# Patient Record
Sex: Male | Born: 1935 | Race: White | Hispanic: No | Smoking: Former smoker
Health system: Southern US, Community
[De-identification: ages and names within clinical notes are randomized; demographics above are authoritative.]

## PROBLEM LIST (undated history)

## (undated) DIAGNOSIS — I739 Peripheral vascular disease, unspecified: Secondary | ICD-10-CM

## (undated) DIAGNOSIS — IMO0002 Reserved for concepts with insufficient information to code with codable children: Secondary | ICD-10-CM

## (undated) DIAGNOSIS — E119 Type 2 diabetes mellitus without complications: Secondary | ICD-10-CM

## (undated) DIAGNOSIS — E785 Hyperlipidemia, unspecified: Secondary | ICD-10-CM

## (undated) DIAGNOSIS — I4891 Unspecified atrial fibrillation: Secondary | ICD-10-CM

## (undated) DIAGNOSIS — R972 Elevated prostate specific antigen [PSA]: Secondary | ICD-10-CM

## (undated) DIAGNOSIS — I2510 Atherosclerotic heart disease of native coronary artery without angina pectoris: Secondary | ICD-10-CM

## (undated) DIAGNOSIS — I482 Chronic atrial fibrillation, unspecified: Secondary | ICD-10-CM

## (undated) DIAGNOSIS — I6529 Occlusion and stenosis of unspecified carotid artery: Secondary | ICD-10-CM

## (undated) DIAGNOSIS — R413 Other amnesia: Secondary | ICD-10-CM

## (undated) DIAGNOSIS — K4090 Unilateral inguinal hernia, without obstruction or gangrene, not specified as recurrent: Secondary | ICD-10-CM

## (undated) DIAGNOSIS — I10 Essential (primary) hypertension: Secondary | ICD-10-CM

## (undated) HISTORY — DX: Unspecified atrial fibrillation: I48.91

## (undated) HISTORY — DX: Elevated prostate specific antigen (PSA): R97.20

## (undated) HISTORY — DX: Other amnesia: R41.3

## (undated) HISTORY — DX: Occlusion and stenosis of unspecified carotid artery: I65.29

## (undated) HISTORY — DX: Essential (primary) hypertension: I10

## (undated) HISTORY — DX: Hyperlipidemia, unspecified: E78.5

## (undated) HISTORY — DX: Unilateral inguinal hernia, without obstruction or gangrene, not specified as recurrent: K40.90

## (undated) HISTORY — DX: Peripheral vascular disease, unspecified: I73.9

## (undated) HISTORY — DX: Atherosclerotic heart disease of native coronary artery without angina pectoris: I25.10

## (undated) HISTORY — DX: Chronic atrial fibrillation, unspecified: I48.20

## (undated) HISTORY — DX: Reserved for concepts with insufficient information to code with codable children: IMO0002

## (undated) HISTORY — PX: INGUINAL HERNIA REPAIR: SHX194

## (undated) HISTORY — DX: Type 2 diabetes mellitus without complications: E11.9

## (undated) HISTORY — PX: OTHER SURGICAL HISTORY: SHX169

## (undated) HISTORY — PX: HEMORRHOID SURGERY: SHX153

---

## 1998-04-28 HISTORY — PX: FLEXIBLE SIGMOIDOSCOPY: SHX1649

## 1998-05-29 ENCOUNTER — Other Ambulatory Visit: Admission: RE | Admit: 1998-05-29 | Discharge: 1998-05-29 | Payer: Self-pay | Admitting: Urology

## 2000-01-23 ENCOUNTER — Emergency Department (HOSPITAL_COMMUNITY): Admission: EM | Admit: 2000-01-23 | Discharge: 2000-01-23 | Payer: Self-pay | Admitting: Emergency Medicine

## 2000-01-23 ENCOUNTER — Encounter: Payer: Self-pay | Admitting: Emergency Medicine

## 2002-10-30 LAB — HM COLONOSCOPY

## 2003-10-24 ENCOUNTER — Emergency Department (HOSPITAL_COMMUNITY): Admission: EM | Admit: 2003-10-24 | Discharge: 2003-10-24 | Payer: Self-pay | Admitting: Family Medicine

## 2003-10-28 ENCOUNTER — Emergency Department (HOSPITAL_COMMUNITY): Admission: EM | Admit: 2003-10-28 | Discharge: 2003-10-28 | Payer: Self-pay | Admitting: Family Medicine

## 2003-11-11 ENCOUNTER — Encounter: Admission: RE | Admit: 2003-11-11 | Discharge: 2003-11-11 | Payer: Self-pay | Admitting: Internal Medicine

## 2004-03-17 ENCOUNTER — Ambulatory Visit: Payer: Self-pay

## 2004-04-14 ENCOUNTER — Ambulatory Visit: Payer: Self-pay | Admitting: *Deleted

## 2004-05-12 ENCOUNTER — Ambulatory Visit: Payer: Self-pay | Admitting: Cardiology

## 2004-06-09 ENCOUNTER — Ambulatory Visit: Payer: Self-pay | Admitting: Cardiology

## 2004-07-07 ENCOUNTER — Ambulatory Visit: Payer: Self-pay | Admitting: *Deleted

## 2004-07-14 ENCOUNTER — Ambulatory Visit: Payer: Self-pay | Admitting: Cardiology

## 2004-08-04 ENCOUNTER — Ambulatory Visit: Payer: Self-pay | Admitting: Internal Medicine

## 2004-08-05 ENCOUNTER — Ambulatory Visit: Payer: Self-pay | Admitting: Internal Medicine

## 2004-08-07 ENCOUNTER — Ambulatory Visit: Payer: Self-pay | Admitting: Internal Medicine

## 2004-08-11 ENCOUNTER — Ambulatory Visit: Payer: Self-pay | Admitting: Cardiology

## 2004-08-25 ENCOUNTER — Ambulatory Visit: Payer: Self-pay | Admitting: Cardiology

## 2004-09-15 ENCOUNTER — Ambulatory Visit: Payer: Self-pay | Admitting: Internal Medicine

## 2004-09-18 ENCOUNTER — Ambulatory Visit: Payer: Self-pay | Admitting: Internal Medicine

## 2004-10-16 ENCOUNTER — Ambulatory Visit: Payer: Self-pay | Admitting: *Deleted

## 2004-11-06 ENCOUNTER — Ambulatory Visit: Payer: Self-pay | Admitting: Internal Medicine

## 2004-11-06 ENCOUNTER — Ambulatory Visit: Payer: Self-pay | Admitting: Cardiology

## 2004-12-04 ENCOUNTER — Ambulatory Visit: Payer: Self-pay | Admitting: Cardiovascular Disease

## 2004-12-07 ENCOUNTER — Ambulatory Visit: Payer: Self-pay | Admitting: Internal Medicine

## 2004-12-18 ENCOUNTER — Ambulatory Visit: Payer: Self-pay | Admitting: Cardiology

## 2005-01-14 ENCOUNTER — Ambulatory Visit: Payer: Self-pay | Admitting: Cardiology

## 2005-02-03 ENCOUNTER — Ambulatory Visit: Payer: Self-pay | Admitting: *Deleted

## 2005-02-12 ENCOUNTER — Ambulatory Visit: Payer: Self-pay | Admitting: Internal Medicine

## 2005-02-19 ENCOUNTER — Ambulatory Visit: Payer: Self-pay | Admitting: Internal Medicine

## 2005-02-22 ENCOUNTER — Ambulatory Visit: Payer: Self-pay | Admitting: Internal Medicine

## 2005-03-12 ENCOUNTER — Ambulatory Visit: Payer: Self-pay | Admitting: Internal Medicine

## 2005-04-07 ENCOUNTER — Ambulatory Visit: Payer: Self-pay | Admitting: Cardiology

## 2005-04-21 ENCOUNTER — Ambulatory Visit: Payer: Self-pay | Admitting: Cardiology

## 2005-05-14 ENCOUNTER — Ambulatory Visit: Payer: Self-pay | Admitting: Cardiology

## 2005-06-04 ENCOUNTER — Ambulatory Visit: Payer: Self-pay | Admitting: Cardiology

## 2005-06-11 ENCOUNTER — Ambulatory Visit: Payer: Self-pay | Admitting: Internal Medicine

## 2005-07-02 ENCOUNTER — Ambulatory Visit: Payer: Self-pay | Admitting: Internal Medicine

## 2005-07-30 ENCOUNTER — Ambulatory Visit: Payer: Self-pay | Admitting: Internal Medicine

## 2005-08-25 ENCOUNTER — Ambulatory Visit: Payer: Self-pay | Admitting: *Deleted

## 2005-09-22 ENCOUNTER — Ambulatory Visit: Payer: Self-pay | Admitting: Internal Medicine

## 2005-10-15 ENCOUNTER — Ambulatory Visit: Payer: Self-pay | Admitting: Cardiology

## 2005-10-18 ENCOUNTER — Ambulatory Visit: Payer: Self-pay | Admitting: Cardiology

## 2005-11-26 ENCOUNTER — Ambulatory Visit: Payer: Self-pay | Admitting: Internal Medicine

## 2005-11-26 ENCOUNTER — Ambulatory Visit: Payer: Self-pay | Admitting: Cardiology

## 2005-12-24 ENCOUNTER — Ambulatory Visit: Payer: Self-pay | Admitting: Internal Medicine

## 2006-02-18 ENCOUNTER — Ambulatory Visit: Payer: Self-pay | Admitting: Cardiology

## 2006-03-18 ENCOUNTER — Ambulatory Visit: Payer: Self-pay | Admitting: Cardiology

## 2006-04-08 ENCOUNTER — Ambulatory Visit: Payer: Self-pay | Admitting: Cardiology

## 2006-04-22 ENCOUNTER — Ambulatory Visit: Payer: Self-pay | Admitting: Cardiology

## 2006-04-29 ENCOUNTER — Ambulatory Visit: Payer: Self-pay | Admitting: Internal Medicine

## 2006-05-16 ENCOUNTER — Ambulatory Visit: Payer: Self-pay | Admitting: Cardiology

## 2006-06-22 ENCOUNTER — Ambulatory Visit: Payer: Self-pay | Admitting: Cardiology

## 2006-07-20 ENCOUNTER — Ambulatory Visit: Payer: Self-pay | Admitting: Cardiology

## 2006-08-02 ENCOUNTER — Ambulatory Visit: Payer: Self-pay | Admitting: Internal Medicine

## 2006-08-17 ENCOUNTER — Ambulatory Visit: Payer: Self-pay | Admitting: Cardiology

## 2006-09-14 ENCOUNTER — Ambulatory Visit: Payer: Self-pay | Admitting: Cardiology

## 2006-10-12 ENCOUNTER — Ambulatory Visit: Payer: Self-pay | Admitting: Cardiology

## 2006-11-01 ENCOUNTER — Ambulatory Visit: Payer: Self-pay | Admitting: Internal Medicine

## 2006-11-01 LAB — CONVERTED CEMR LAB: Hgb A1c MFr Bld: 7 % — ABNORMAL HIGH (ref 4.6–6.0)

## 2006-11-02 ENCOUNTER — Ambulatory Visit: Payer: Self-pay | Admitting: Cardiology

## 2006-11-02 HISTORY — PX: ELECTROCARDIOGRAM: SHX264

## 2006-11-02 LAB — CONVERTED CEMR LAB
ALT: 27 units/L (ref 0–40)
AST: 30 units/L (ref 0–37)
Albumin: 3.8 g/dL (ref 3.5–5.2)
Alkaline Phosphatase: 62 units/L (ref 39–117)
BUN: 14 mg/dL (ref 6–23)
Basophils Absolute: 0 10*3/uL (ref 0.0–0.1)
Basophils Relative: 0.1 % (ref 0.0–1.0)
Bilirubin, Direct: 0.2 mg/dL (ref 0.0–0.3)
CO2: 28 meq/L (ref 19–32)
Calcium: 8.9 mg/dL (ref 8.4–10.5)
Chloride: 108 meq/L (ref 96–112)
Cholesterol: 175 mg/dL (ref 0–200)
Creatinine, Ser: 1.1 mg/dL (ref 0.4–1.5)
Eosinophils Absolute: 0.1 10*3/uL (ref 0.0–0.6)
Eosinophils Relative: 0.8 % (ref 0.0–5.0)
GFR calc Af Amer: 84 mL/min
GFR calc non Af Amer: 69 mL/min
Glucose, Bld: 116 mg/dL — ABNORMAL HIGH (ref 70–99)
HCT: 43.3 % (ref 39.0–52.0)
HDL: 41.3 mg/dL (ref >39.0)
Hemoglobin: 15.1 g/dL (ref 13.0–17.0)
LDL Cholesterol: 99 mg/dL (ref 0–99)
Lymphocytes Relative: 22 % (ref 12.0–46.0)
MCHC: 34.8 g/dL (ref 30.0–36.0)
MCV: 93 fL (ref 78.0–100.0)
Monocytes Absolute: 0.8 10*3/uL — ABNORMAL HIGH (ref 0.2–0.7)
Monocytes Relative: 10.1 % (ref 3.0–11.0)
Neutro Abs: 5.4 10*3/uL (ref 1.4–7.7)
Neutrophils Relative %: 67 % (ref 43.0–77.0)
Platelets: 221 10*3/uL (ref 150–400)
Potassium: 4 meq/L (ref 3.5–5.1)
RBC: 4.66 M/uL (ref 4.22–5.81)
RDW: 12.4 % (ref 11.5–14.6)
Sodium: 140 meq/L (ref 135–145)
Total Bilirubin: 0.8 mg/dL (ref 0.3–1.2)
Total CHOL/HDL Ratio: 4.2
Total Protein: 6.3 g/dL (ref 6.0–8.3)
Triglycerides: 175 mg/dL — ABNORMAL HIGH (ref 0–149)
VLDL: 35 mg/dL (ref 0–40)
WBC: 8.1 10*3/uL (ref 4.5–10.5)

## 2006-11-15 ENCOUNTER — Ambulatory Visit: Payer: Self-pay

## 2006-11-15 HISTORY — PX: OTHER SURGICAL HISTORY: SHX169

## 2006-11-30 ENCOUNTER — Ambulatory Visit: Payer: Self-pay | Admitting: Cardiovascular Disease

## 2006-12-08 ENCOUNTER — Ambulatory Visit: Payer: Self-pay | Admitting: *Deleted

## 2006-12-09 ENCOUNTER — Encounter: Payer: Self-pay | Admitting: Internal Medicine

## 2006-12-09 DIAGNOSIS — E785 Hyperlipidemia, unspecified: Secondary | ICD-10-CM

## 2006-12-09 DIAGNOSIS — R972 Elevated prostate specific antigen [PSA]: Secondary | ICD-10-CM

## 2006-12-09 DIAGNOSIS — I2510 Atherosclerotic heart disease of native coronary artery without angina pectoris: Secondary | ICD-10-CM

## 2006-12-09 DIAGNOSIS — I4891 Unspecified atrial fibrillation: Secondary | ICD-10-CM | POA: Insufficient documentation

## 2006-12-09 DIAGNOSIS — E1121 Type 2 diabetes mellitus with diabetic nephropathy: Secondary | ICD-10-CM | POA: Insufficient documentation

## 2006-12-21 ENCOUNTER — Ambulatory Visit: Payer: Self-pay | Admitting: Cardiology

## 2007-01-02 ENCOUNTER — Ambulatory Visit: Payer: Self-pay | Admitting: Cardiology

## 2007-01-12 ENCOUNTER — Ambulatory Visit: Payer: Self-pay | Admitting: *Deleted

## 2007-01-12 ENCOUNTER — Encounter: Admission: RE | Admit: 2007-01-12 | Discharge: 2007-01-12 | Payer: Self-pay | Admitting: *Deleted

## 2007-03-07 ENCOUNTER — Encounter (INDEPENDENT_AMBULATORY_CARE_PROVIDER_SITE_OTHER): Payer: Self-pay | Admitting: *Deleted

## 2007-03-07 ENCOUNTER — Inpatient Hospital Stay (HOSPITAL_COMMUNITY): Admission: RE | Admit: 2007-03-07 | Discharge: 2007-03-08 | Payer: Self-pay | Admitting: *Deleted

## 2007-03-07 ENCOUNTER — Ambulatory Visit: Payer: Self-pay | Admitting: *Deleted

## 2007-03-07 DIAGNOSIS — Z9889 Other specified postprocedural states: Secondary | ICD-10-CM | POA: Insufficient documentation

## 2007-03-10 ENCOUNTER — Ambulatory Visit: Payer: Self-pay | Admitting: Cardiology

## 2007-03-17 ENCOUNTER — Ambulatory Visit: Payer: Self-pay | Admitting: Cardiology

## 2007-03-23 ENCOUNTER — Encounter: Payer: Self-pay | Admitting: Internal Medicine

## 2007-03-23 ENCOUNTER — Ambulatory Visit: Payer: Self-pay | Admitting: *Deleted

## 2007-04-17 ENCOUNTER — Ambulatory Visit: Payer: Self-pay | Admitting: Cardiology

## 2007-04-26 ENCOUNTER — Ambulatory Visit: Payer: Self-pay | Admitting: Internal Medicine

## 2007-04-26 DIAGNOSIS — F0280 Dementia in other diseases classified elsewhere without behavioral disturbance: Secondary | ICD-10-CM

## 2007-04-26 DIAGNOSIS — I739 Peripheral vascular disease, unspecified: Secondary | ICD-10-CM

## 2007-04-26 DIAGNOSIS — G309 Alzheimer's disease, unspecified: Secondary | ICD-10-CM

## 2007-04-28 ENCOUNTER — Encounter: Payer: Self-pay | Admitting: Internal Medicine

## 2007-05-23 ENCOUNTER — Ambulatory Visit: Payer: Self-pay | Admitting: Cardiology

## 2007-06-20 ENCOUNTER — Ambulatory Visit: Payer: Self-pay | Admitting: Cardiology

## 2007-07-19 ENCOUNTER — Ambulatory Visit: Payer: Self-pay | Admitting: Internal Medicine

## 2007-08-02 ENCOUNTER — Ambulatory Visit: Payer: Self-pay | Admitting: Cardiology

## 2007-08-23 ENCOUNTER — Ambulatory Visit: Payer: Self-pay | Admitting: Internal Medicine

## 2007-09-15 ENCOUNTER — Ambulatory Visit: Payer: Self-pay | Admitting: Cardiology

## 2007-10-13 ENCOUNTER — Ambulatory Visit: Payer: Self-pay | Admitting: Cardiology

## 2007-11-10 ENCOUNTER — Ambulatory Visit: Payer: Self-pay | Admitting: Cardiology

## 2007-11-14 ENCOUNTER — Ambulatory Visit: Payer: Self-pay | Admitting: Cardiology

## 2007-11-14 LAB — CONVERTED CEMR LAB
Basophils Absolute: 0 10*3/uL (ref 0.0–0.1)
Basophils Relative: 0 % (ref 0.0–1.0)
Chloride: 104 meq/L (ref 96–112)
Creatinine, Ser: 1.2 mg/dL (ref 0.4–1.5)
Eosinophils Absolute: 0.1 10*3/uL (ref 0.0–0.7)
GFR calc non Af Amer: 62 mL/min
MCHC: 35.4 g/dL (ref 30.0–36.0)
MCV: 93.4 fL (ref 78.0–100.0)
Neutrophils Relative %: 66.3 % (ref 43.0–77.0)
Platelets: 227 10*3/uL (ref 150–400)
RBC: 4.81 M/uL (ref 4.22–5.81)
RDW: 13.1 % (ref 11.5–14.6)
Sodium: 138 meq/L (ref 135–145)

## 2007-12-18 ENCOUNTER — Ambulatory Visit: Payer: Self-pay | Admitting: Cardiology

## 2007-12-27 ENCOUNTER — Ambulatory Visit: Payer: Self-pay | Admitting: Internal Medicine

## 2007-12-27 DIAGNOSIS — K4090 Unilateral inguinal hernia, without obstruction or gangrene, not specified as recurrent: Secondary | ICD-10-CM | POA: Insufficient documentation

## 2007-12-28 ENCOUNTER — Ambulatory Visit: Payer: Self-pay | Admitting: Cardiology

## 2008-01-18 ENCOUNTER — Ambulatory Visit: Payer: Self-pay | Admitting: Cardiology

## 2008-03-11 ENCOUNTER — Telehealth: Payer: Self-pay | Admitting: Internal Medicine

## 2008-03-14 ENCOUNTER — Ambulatory Visit: Payer: Self-pay | Admitting: Cardiovascular Disease

## 2008-03-21 ENCOUNTER — Ambulatory Visit: Payer: Self-pay | Admitting: Cardiology

## 2008-04-02 ENCOUNTER — Ambulatory Visit: Payer: Self-pay | Admitting: Internal Medicine

## 2008-04-04 ENCOUNTER — Ambulatory Visit: Payer: Self-pay | Admitting: Internal Medicine

## 2008-05-02 ENCOUNTER — Ambulatory Visit: Payer: Self-pay | Admitting: Cardiology

## 2008-06-05 ENCOUNTER — Ambulatory Visit: Payer: Self-pay | Admitting: Cardiovascular Disease

## 2008-06-26 ENCOUNTER — Ambulatory Visit: Payer: Self-pay | Admitting: Cardiology

## 2008-07-09 ENCOUNTER — Ambulatory Visit: Payer: Self-pay | Admitting: Cardiovascular Disease

## 2008-07-30 ENCOUNTER — Ambulatory Visit: Payer: Self-pay | Admitting: Cardiology

## 2008-08-13 ENCOUNTER — Ambulatory Visit: Payer: Self-pay | Admitting: Cardiology

## 2008-09-03 ENCOUNTER — Ambulatory Visit: Payer: Self-pay | Admitting: Cardiology

## 2008-10-04 ENCOUNTER — Telehealth: Payer: Self-pay | Admitting: Internal Medicine

## 2008-10-04 ENCOUNTER — Ambulatory Visit: Payer: Self-pay | Admitting: Internal Medicine

## 2008-10-16 ENCOUNTER — Encounter: Payer: Self-pay | Admitting: *Deleted

## 2008-10-17 ENCOUNTER — Telehealth: Payer: Self-pay | Admitting: Internal Medicine

## 2008-11-01 ENCOUNTER — Ambulatory Visit: Payer: Self-pay | Admitting: Internal Medicine

## 2008-11-01 LAB — CONVERTED CEMR LAB: Protime: 21.2

## 2008-11-07 ENCOUNTER — Telehealth: Payer: Self-pay | Admitting: Internal Medicine

## 2008-11-20 ENCOUNTER — Encounter: Payer: Self-pay | Admitting: *Deleted

## 2008-11-23 ENCOUNTER — Emergency Department (HOSPITAL_COMMUNITY): Admission: EM | Admit: 2008-11-23 | Discharge: 2008-11-23 | Payer: Self-pay | Admitting: Emergency Medicine

## 2008-11-29 ENCOUNTER — Ambulatory Visit: Payer: Self-pay | Admitting: Cardiology

## 2008-11-29 LAB — CONVERTED CEMR LAB: POC INR: 4

## 2008-12-13 ENCOUNTER — Ambulatory Visit: Payer: Self-pay | Admitting: Cardiology

## 2008-12-16 ENCOUNTER — Telehealth: Payer: Self-pay | Admitting: Cardiology

## 2008-12-16 ENCOUNTER — Encounter: Payer: Self-pay | Admitting: Internal Medicine

## 2008-12-19 ENCOUNTER — Telehealth: Payer: Self-pay | Admitting: Internal Medicine

## 2008-12-19 ENCOUNTER — Telehealth: Payer: Self-pay | Admitting: Cardiology

## 2008-12-19 ENCOUNTER — Ambulatory Visit: Payer: Self-pay | Admitting: Internal Medicine

## 2008-12-25 ENCOUNTER — Encounter: Admission: RE | Admit: 2008-12-25 | Discharge: 2008-12-25 | Payer: Self-pay | Admitting: Surgery

## 2008-12-26 ENCOUNTER — Ambulatory Visit (HOSPITAL_BASED_OUTPATIENT_CLINIC_OR_DEPARTMENT_OTHER): Admission: RE | Admit: 2008-12-26 | Discharge: 2008-12-26 | Payer: Self-pay | Admitting: Surgery

## 2008-12-26 ENCOUNTER — Encounter (INDEPENDENT_AMBULATORY_CARE_PROVIDER_SITE_OTHER): Payer: Self-pay | Admitting: Surgery

## 2009-01-03 ENCOUNTER — Ambulatory Visit: Payer: Self-pay | Admitting: Cardiovascular Disease

## 2009-01-03 LAB — CONVERTED CEMR LAB: POC INR: 2.1

## 2009-01-09 DIAGNOSIS — I10 Essential (primary) hypertension: Secondary | ICD-10-CM

## 2009-01-09 DIAGNOSIS — IMO0002 Reserved for concepts with insufficient information to code with codable children: Secondary | ICD-10-CM | POA: Insufficient documentation

## 2009-01-09 DIAGNOSIS — I6529 Occlusion and stenosis of unspecified carotid artery: Secondary | ICD-10-CM

## 2009-01-09 DIAGNOSIS — Z9889 Other specified postprocedural states: Secondary | ICD-10-CM | POA: Insufficient documentation

## 2009-01-10 ENCOUNTER — Ambulatory Visit: Payer: Self-pay | Admitting: Cardiology

## 2009-01-22 ENCOUNTER — Encounter: Payer: Self-pay | Admitting: Internal Medicine

## 2009-01-24 ENCOUNTER — Ambulatory Visit: Payer: Self-pay

## 2009-01-24 ENCOUNTER — Encounter: Payer: Self-pay | Admitting: Cardiology

## 2009-01-24 ENCOUNTER — Ambulatory Visit: Payer: Self-pay | Admitting: Internal Medicine

## 2009-01-24 LAB — CONVERTED CEMR LAB: POC INR: 2.2

## 2009-02-21 ENCOUNTER — Ambulatory Visit: Payer: Self-pay | Admitting: Internal Medicine

## 2009-03-07 ENCOUNTER — Ambulatory Visit: Payer: Self-pay | Admitting: Internal Medicine

## 2009-03-07 LAB — CONVERTED CEMR LAB: POC INR: 1.8

## 2009-03-21 ENCOUNTER — Ambulatory Visit: Payer: Self-pay | Admitting: Cardiology

## 2009-03-21 LAB — CONVERTED CEMR LAB: POC INR: 2.1

## 2009-04-08 ENCOUNTER — Telehealth: Payer: Self-pay | Admitting: Internal Medicine

## 2009-04-18 ENCOUNTER — Ambulatory Visit: Payer: Self-pay | Admitting: Cardiovascular Disease

## 2009-05-06 ENCOUNTER — Ambulatory Visit: Payer: Self-pay | Admitting: Cardiology

## 2009-05-06 LAB — CONVERTED CEMR LAB: INR: 1.9

## 2009-05-19 ENCOUNTER — Telehealth: Payer: Self-pay | Admitting: Internal Medicine

## 2009-06-03 ENCOUNTER — Ambulatory Visit: Payer: Self-pay | Admitting: Cardiology

## 2009-07-01 ENCOUNTER — Ambulatory Visit: Payer: Self-pay | Admitting: Cardiology

## 2009-07-29 ENCOUNTER — Ambulatory Visit: Payer: Self-pay | Admitting: Cardiology

## 2009-08-26 ENCOUNTER — Ambulatory Visit: Payer: Self-pay | Admitting: Cardiology

## 2009-08-26 LAB — CONVERTED CEMR LAB: POC INR: 2.5

## 2009-09-23 ENCOUNTER — Ambulatory Visit: Payer: Self-pay | Admitting: Cardiology

## 2009-09-23 LAB — CONVERTED CEMR LAB: POC INR: 2.5

## 2009-10-16 ENCOUNTER — Ambulatory Visit: Payer: Self-pay | Admitting: Internal Medicine

## 2009-10-16 DIAGNOSIS — L578 Other skin changes due to chronic exposure to nonionizing radiation: Secondary | ICD-10-CM | POA: Insufficient documentation

## 2009-10-21 ENCOUNTER — Ambulatory Visit: Payer: Self-pay | Admitting: Cardiology

## 2009-11-18 ENCOUNTER — Ambulatory Visit: Payer: Self-pay | Admitting: Internal Medicine

## 2009-11-18 LAB — CONVERTED CEMR LAB: POC INR: 2.7

## 2009-12-16 ENCOUNTER — Ambulatory Visit: Payer: Self-pay | Admitting: Cardiology

## 2009-12-16 LAB — CONVERTED CEMR LAB: POC INR: 3.5

## 2010-01-06 ENCOUNTER — Ambulatory Visit: Payer: Self-pay | Admitting: Cardiovascular Disease

## 2010-02-16 ENCOUNTER — Encounter: Payer: Self-pay | Admitting: Internal Medicine

## 2010-02-16 ENCOUNTER — Telehealth: Payer: Self-pay | Admitting: Internal Medicine

## 2010-03-16 ENCOUNTER — Ambulatory Visit: Payer: Self-pay | Admitting: Internal Medicine

## 2010-03-16 LAB — CONVERTED CEMR LAB: POC INR: 2.9

## 2010-05-22 ENCOUNTER — Ambulatory Visit: Admission: RE | Admit: 2010-05-22 | Discharge: 2010-05-22 | Payer: Self-pay | Source: Home / Self Care

## 2010-06-12 ENCOUNTER — Ambulatory Visit: Admission: RE | Admit: 2010-06-12 | Discharge: 2010-06-12 | Payer: Self-pay | Source: Home / Self Care

## 2010-06-14 LAB — CONVERTED CEMR LAB
ALT: 25 units/L (ref 0–53)
Basophils Relative: 0.3 % (ref 0.0–3.0)
Chloride: 104 meq/L (ref 96–112)
Cholesterol: 171 mg/dL (ref 0–200)
Eosinophils Absolute: 0.1 10*3/uL (ref 0.0–0.7)
Eosinophils Relative: 1.3 % (ref 0.0–5.0)
HDL: 60.5 mg/dL (ref >39.00)
Hgb A1c MFr Bld: 6.4 % (ref 4.6–6.5)
Lymphocytes Relative: 24.7 % (ref 12.0–46.0)
MCHC: 34.9 g/dL (ref 30.0–36.0)
Neutrophils Relative %: 63.1 % (ref 43.0–77.0)
Potassium: 4.5 meq/L (ref 3.5–5.1)
RBC: 4.44 M/uL (ref 4.22–5.81)
Total Protein: 6.5 g/dL (ref 6.0–8.3)
Triglycerides: 99 mg/dL (ref 0.0–149.0)
VLDL: 19.8 mg/dL (ref 0.0–40.0)
WBC: 7.3 10*3/uL (ref 4.5–10.5)

## 2010-06-16 NOTE — Medication Information (Signed)
Summary: rov/ewj  Anticoagulant Therapy  Managed by: Weston Brass, PharmD Referring MD: Charlies Constable MD PCP: Link Snuffer MD: Shirlee Latch MD, Dalton Indication 1: Atrial Fibrillation (ICD-427.31) Lab Used: [facility]  Site: [facility] INR POC 2.5 INR RANGE 2-3  Dietary changes: no    Health status changes: no    Bleeding/hemorrhagic complications: no    Recent/future hospitalizations: no    Any changes in medication regimen? no    Recent/future dental: no  Any missed doses?: no       Is patient compliant with meds? yes       Allergies: No Known Drug Allergies  Anticoagulation Management History:      The patient is taking warfarin and comes in today for a routine follow up visit.  Positive risk factors for bleeding include an age of 75 years or older and presence of serious comorbidities.  The bleeding index is 'intermediate risk'.  Positive CHADS2 values include History of HTN, Age > 36 years old, and History of Diabetes.  The start date was 01/28/2003.  His last INR was 1.9.  Anticoagulation responsible provider: Shirlee Latch MD, Dalton.  INR POC: 2.5.  Cuvette Lot#: [id number].  Exp: 09/2010.    Anticoagulation Management Assessment/Plan:      The patient's current anticoagulation dose is Coumadin 5 mg  tabs: take as directed.  The target INR is 2 - 3.  The next INR is due 09/23/2009.  Anticoagulation instructions were given to patient.  Results were reviewed/authorized by Weston Brass, PharmD.  He was notified by Weston Brass PharmD.         Prior Anticoagulation Instructions: INR 2.7  Continue on same dosage 1 tablet daily.  Recheck in 4 weeks.    Current Anticoagulation Instructions: INR 2.5  Continue same dose of 1 tablet every day

## 2010-06-16 NOTE — Medication Information (Signed)
Summary: ROV.MP  Anticoagulant Therapy  Managed by: Cloyde Reams, RN, BSN Referring MD: Charlies Constable MD PCP: Link Snuffer MD: Myrtis Ser MD, Tinnie Gens Indication 1: Atrial Fibrillation (ICD-427.31) Lab Used: [facility] [place] Site: [facility] INR POC 2.4 INR RANGE 2-3  Dietary changes: no    Health status changes: no    Bleeding/hemorrhagic complications: no    Recent/future hospitalizations: no    Any changes in medication regimen? no    Recent/future dental: no  Any missed doses?: no       Is patient compliant with meds? yes       Allergies (verified): No Known Drug Allergies  Anticoagulation Management History:      The patient is taking warfarin and comes in today for a routine follow up visit.  Positive risk factors for bleeding include an age of 75 years or older and presence of serious comorbidities.  The bleeding index is 'intermediate risk'.  Positive CHADS2 values include History of HTN, Age > 86 years old, and History of Diabetes.  The start date was 01/28/2003.  His last INR was 1.9.  Anticoagulation responsible provider: Myrtis Ser MD, Tinnie Gens.  INR POC: 2.4.  Cuvette Lot#: [id number].  Exp: 08/2010.    Anticoagulation Management Assessment/Plan:      The patient's current anticoagulation dose is Coumadin 5 mg  tabs: take as directed.  The target INR is 2 - 3.  The next INR is due 07/29/2009.  Anticoagulation instructions were given to patient.  Results were reviewed/authorized by Cloyde Reams, RN, BSN.  He was notified by Cloyde Reams RN.         Prior Anticoagulation Instructions: INR 2.4  Continue same dose of 1 tablet daily. Recheck in 4 weeks.  Current Anticoagulation Instructions: INR 2.4  Continue on same dosage 1 tablet daily.  Recheck in 4 weeks.

## 2010-06-16 NOTE — Assessment & Plan Note (Signed)
Summary: YEARLY FU/ LABS SAME DAY/NWS   Vital Signs:  Patient profile:   77 year old male Height:      74 inches (187.96 cm) Weight:      182.4 pounds (82.91 kg) BMI:     23.50 O2 Sat:      95 % on Room air Temp:     97.1 degrees F (36.17 degrees C) oral Pulse rate:   63 / minute BP sitting:   120 / 68  (left arm) Cuff size:   large  Vitals Entered By: Orlan Leavens (October 16, 2009 11:32 AM)  O2 Flow:  Room air CC: Yearly follow-up/ Req witten rx on all meds/ Clarification on amayl pt states only taking 1/2 qd  Is Patient Diabetic? Yes Did you bring your meter with you today? No Pain Assessment Patient in pain? no        Primary Care Provider:  Rosela Supak  CC:  Yearly follow-up/ Req witten rx on all meds/ Clarification on amayl pt states only taking 1/2 qd .  History of Present Illness: Patient presents for general medical follow-up. He has been feeling well. His daughter is concerned for hearing loss. She also wants to have him referred to Dr. Margo Aye for derm follow-up. He is due for routine lab. He is due to MMSE.   Current Medications (verified): 1)  Lanoxin 0.25 Mg  Tabs (Digoxin) .... Take 1 By Mouth Qd 2)  Altace 10 Mg  Caps (Ramipril) .... Take 1 By Mouth Qd 3)  Aricept 10 Mg  Tabs (Donepezil Hcl) .... Take 1 By Mouth Qd 4)  Coumadin 5 Mg  Tabs (Warfarin Sodium) .... Take As Directed 5)  Simvastatin 20 Mg  Tabs (Simvastatin) .... Take 1 By Mouth Qd 6)  Amaryl 1 Mg  Tabs (Glimepiride) .... Take 1 By Mouth Qd 7)  Aspirin 81 Mg  Tabs (Aspirin) .... Take 1 Tablet By Mouth Once A Day  Allergies (verified): No Known Drug Allergies  Past History:  Past Medical History: Last updated: 01/09/2009 DEGENERATIVE DISC DISEASE (ICD-722.6) CAROTID ARTERY STENOSIS, RIGHT (ICD-433.10) HYPERTENSION (ICD-401.9) INGUINAL HERNIA, RIGHT (ICD-550.90) MEMORY LOSS (ICD-780.93) PERIPHERAL VASCULAR DISEASE (ICD-443.9) DIABETES MELLITUS, TYPE II (ICD-250.00) CORONARY ARTERY DISEASE  (ICD-414.00) PSA, INCREASED (ICD-790.93) ATRIAL FIBRILLATION (ICD-427.31) HYPERLIPIDEMIA (ICD-272.4) 1. Chronic atrial fibrillation, on rate control, medicines and     Coumadin. 2. Hypertension. 3. Diabetes. 4. Hyperlipidemia. 5. Carotid stenosis status post left carotid endarterectomy in 2008.    Past Surgical History: Last updated: 01/09/2009 Flexible Sigmoidoscopy (04/28/1998) Carotid Duplex (11/15/2006) EKG (11/02/2006) CAROTID ENDARTERECTOMY, LEFT, HX OF (ICD-V15.1) HEMORRHOIDECTOMY, HX OF (ICD-V45.89) INGUINAL HERNIORRHAPHY, RIGHT, HX OF (ICD-V45.89)  Family History: Last updated: 01-09-2008 Mother: died of stroke at 38 Father: died at age 61 Brother: died in his 58s from Parkinson's disease Sister: died in her 41s  Social History: Last updated: 12/19/2008 Widowed; married in 1960 He has grown [phone]) and son ( ~1966), several grandchildren. 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   He enjoys playing lots of golf, and traveling with his girlfriend.    Risk Factors: Exercise: yes (09-Jan-2008)  Risk Factors: Smoking Status: quit (12/09/2006)  Review of Systems  The patient denies anorexia, fever, weight loss, weight gain, hoarseness, chest pain, syncope, dyspnea on exertion, prolonged cough, hemoptysis, abdominal pain, severe indigestion/heartburn, incontinence, muscle weakness, difficulty walking, depression, abnormal bleeding, and enlarged lymph nodes.    Physical Exam  General:  Tall, thin but well nourished white male in  no distress Head:  Normocephalic and atraumatic without obvious abnormalities. No apparent alopecia or balding. Eyes:  vision grossly intact, pupils equal, pupils round, corneas and lenses clear, and no injection.   Ears:  cerumen impaction right EAC - after irrigation small abrasion to the canal, TM normal. Left EAC/TM normal Nose:  no external deformity and no external erythema.   Mouth:   Oral mucosa and oropharynx without lesions or exudates.  Teeth in good repair. Neck:  supple, full ROM, no thyromegaly, and no carotid bruits.   Chest Wall:  No deformities, masses, tenderness or gynecomastia noted. Lungs:  Normal respiratory effort, chest expands symmetrically. Lungs are clear to auscultation, no crackles or wheezes. Heart:  IRIR rate controlled. No murmur Abdomen:  soft, non-tender, normal bowel sounds, and no hepatomegaly.   Prostate:  deferred to age Msk:  normal ROM, no joint tenderness, no joint swelling, no joint warmth, no redness over joints, and no joint deformities.   Pulses:  2+ radial  Extremities:  No clubbing, cyanosis, edema, or deformity noted with normal full range of motion of all joints.   Neurologic:  MMSE 1. day - no, month -no, year - no 2. President - yes; Gov -a lady; current events-weak 3.  5 fwd -1 error; 5 rev - no, 4 rev yes 4. nickles -25, serial 7's -ok, change making 5. 1/3 6. parables - rolling stone, glass house - concrete 7. judgement - ok 8.clock face - did well but couldn't place hands Skin:  turgor normal, color normal, no rashes, and no ulcerations.  peeling on legs after sunburn Cervical Nodes:  no anterior cervical adenopathy and no posterior cervical adenopathy.   Psych:  Oriented X3, normally interactive, good eye contact, and not anxious appearing.     Impression & Recommendations:  Problem # 1:  UNSPECIFIED DERMATITIS DUE TO SUN (ICD-692.70) Patient with lots of solar skin changes  Plan - refer to Dr. Margo Aye for full skin exam.  Orders: Dermatology Referral (Derma)  Problem # 2:  DEGENERATIVE DISC DISEASE (ICD-722.6) Stable and asymptomatic  Problem # 3:  MEMORY LOSS (ICD-780.93) Serial MMSE - no significant change from last exam August '10. Dtr reports that routines and message center has helped. They will be getting him a wtch that reliably has the day/date. Very supportive family.   Plan - continue  aricept  Problem # 4:  DIABETES MELLITUS, TYPE II (ICD-250.00) Patient has lost 20+ lbs with proper diet. No CBGs  Plan - A1C with recommendations to follow  His updated medication list for this problem includes:    Altace 10 Mg Caps (Ramipril) .Marland Kitchen... Take 1 by mouth qd    Amaryl 1 Mg Tabs (Glimepiride) .Marland Kitchen... Take 1 by mouth qd    Aspirin 81 Mg Tabs (Aspirin) .Marland Kitchen... Take 1 tablet by mouth once a day  Orders: TLB-A1C / Hgb A1C (Glycohemoglobin) (83036-A1C)  addendum - A1C 6.4% - good control. Will continue present medications.  Problem # 5:  ATRIAL FIBRILLATION (ICD-427.31) Stable rate. Asymptomatic  His updated medication list for this problem includes:    Lanoxin 0.25 Mg Tabs (Digoxin) .Marland Kitchen... Take 1 by mouth qd    Coumadin 5 Mg Tabs (Warfarin sodium) .Marland Kitchen... Take as directed    Aspirin 81 Mg Tabs (Aspirin) .Marland Kitchen... Take 1 tablet by mouth once a day  Orders: EKG w/ Interpretation (93000)  Problem # 6:  HYPERLIPIDEMIA (ICD-272.4) Assessment: Unchanged Tolerating medication without ill effects. He is due for lab with recommendations to follow.  His updated medication list for this problem includes:    Simvastatin 20 Mg Tabs (Simvastatin) .Marland Kitchen... Take 1 by mouth qd  Orders: TLB-Lipid Panel (80061-LIPID) TLB-Hepatic/Liver Function Pnl (80076-HEPATIC)  Good control with normal LDL on medication with normal liver functions.  Problem # 7:  Preventive Health Care (ICD-V70.0) Normal exam. Last colonoscopy '04. Pneumovax - '99. Labs are in normal limits.  In summary - a very nice man who is medically stable and doing well  Complete Medication List: 1)  Lanoxin 0.25 Mg Tabs (Digoxin) .... Take 1 by mouth qd 2)  Altace 10 Mg Caps (Ramipril) .... Take 1 by mouth qd 3)  Aricept 10 Mg Tabs (Donepezil hcl) .... Take 1 by mouth qd 4)  Coumadin 5 Mg Tabs (Warfarin sodium) .... Take as directed 5)  Simvastatin 20 Mg Tabs (Simvastatin) .... Take 1 by mouth qd 6)  Amaryl 1 Mg Tabs (Glimepiride)  .... Take 1 by mouth qd 7)  Aspirin 81 Mg Tabs (Aspirin) .... Take 1 tablet by mouth once a day  Other Orders: TLB-BMP (Basic Metabolic Panel-BMET) (80048-METABOL) TLB-CBC Platelet - w/Differential (85025-CBCD)  Patient: Juan Carroll Note: All result statuses are Final unless otherwise noted.  Tests: (1) BMP (METABOL)   Sodium                    140 mEq/L                   135-145   Potassium                 4.5 mEq/L                   3.5-5.1   Chloride                  104 mEq/L                   96-112   Carbon Dioxide            29 mEq/L                    19-32   Glucose                   94 mg/dL                    11-91   BUN                       21 mg/dL                    4-78   Creatinine                1.1 mg/dL                   2.9-5.6   Calcium                   8.9 mg/dL                   2.1-30.8   GFR                       72.28 mL/min                >60  Tests: (2) Hemoglobin A1C (A1C)   Hemoglobin A1C  6.4 %                       4.6-6.5     Glycemic Control Guidelines for People with Diabetes:     Non Diabetic:  <6%     Goal of Therapy: <7%     Additional Action Suggested:  >8%   Tests: (3) Lipid Panel (LIPID)   Cholesterol               171 mg/dL                   8-413     ATP III Classification            Desirable:  < 200 mg/dL                    Borderline High:  200 - 239 mg/dL               High:  > = 240 mg/dL   Triglycerides             99.0 mg/dL                  2.4-401.0     Normal:  <150 mg/dL     Borderline High:  272 - 199 mg/dL   HDL                       53.66 mg/dL                 >44.03   VLDL Cholesterol          19.8 mg/dL                  4.7-42.5   LDL Cholesterol           91 mg/dL                    9-56  CHO/HDL Ratio:  CHD Risk                             3                    Men          Women     1/2 Average Risk     3.4          3.3     Average Risk          5.0          4.4     2X Average Risk          9.6           7.1     3X Average Risk          15.0          11.0                           Tests: (4) Hepatic/Liver Function Panel (HEPATIC)   Total Bilirubin      [H]  1.3 mg/dL                   3.8-7.5   Direct Bilirubin          0.3 mg/dL  0.0-0.3   Alkaline Phosphatase      94 U/L                      39-117   AST                       33 U/L                      0-37   ALT                       25 U/L                      0-53   Total Protein             6.5 g/dL                    4.2-5.9   Albumin                   4.1 g/dL                    5.6-3.8  Tests: (5) CBC Platelet w/Diff (CBCD)   White Cell Count          7.3 K/uL                    4.5-10.5   Red Cell Count            4.44 Mil/uL                 4.22-5.81   Hemoglobin                15.0 g/dL                   75.6-43.3   Hematocrit                43.1 %                      39.0-52.0   MCV                       97.2 fl                     78.0-100.0   MCHC                      34.9 g/dL                   29.5-18.8   RDW                       13.3 %                      11.5-14.6   Platelet Count            197.0 K/uL                  150.0-400.0   Neutrophil %              63.1 %                      43.0-77.0   Lymphocyte %  24.7 %                      12.0-46.0   Monocyte %                10.6 %                      3.0-12.0   Eosinophils%              1.3 %                       0.0-5.0   Basophils %               0.3 %                       0.0-3.0   Neutrophill Absolute      4.6 K/uL                    1.4-7.7   Lymphocyte Absolute       1.8 K/uL                    0.7-4.0   Monocyte Absolute         0.8 K/uL                    0.1-1.0  Eosinophils, Absolute                             0.1 K/uL                    0.0-0.7   Basophils Absolute        0.0 K/uL                    0.0-0.1Prescriptions: AMARYL 1 MG  TABS (GLIMEPIRIDE) take 1 by mouth qd  #90 x 3   Entered and Authorized by:    Jacques Navy MD   Signed by:   Jacques Navy MD on 10/16/2009   Method used:   Print then Give to Patient   RxID:   [id number] SIMVASTATIN 20 MG  TABS (SIMVASTATIN) take 1 by mouth qd  #90 x 3   Entered and Authorized by:   Jacques Navy MD   Signed by:   Jacques Navy MD on 10/16/2009   Method used:   Print then Give to Patient   RxID:   [id number] COUMADIN 5 MG  TABS (WARFARIN SODIUM) take as directed  #90 x 3   Entered and Authorized by:   Jacques Navy MD   Signed by:   Jacques Navy MD on 10/16/2009   Method used:   Print then Give to Patient   RxID:   [id number] ARICEPT 10 MG  TABS (DONEPEZIL HCL) take 1 by mouth qd  #90 x 1   Entered and Authorized by:   Jacques Navy MD   Signed by:   Jacques Navy MD on 10/16/2009   Method used:   Print then Give to Patient   RxID:   [id number] ALTACE 10 MG  CAPS (RAMIPRIL) take 1 by mouth qd  #90 x 3   Entered and Authorized by:   Jacques Navy MD   Signed by:   Jacques Navy MD on 10/16/2009  Method used:   Print then Give to Patient   RxID:   [id number] LANOXIN 0.25 MG  TABS (DIGOXIN) take 1 by mouth qd  #90 x 3   Entered and Authorized by:   Jacques Navy MD   Signed by:   Jacques Navy MD on 10/16/2009   Method used:   Print then Give to Patient   RxID:   [id number]

## 2010-06-16 NOTE — Progress Notes (Signed)
Summary: [facility] - Generic Aricept  Phone Note Call from Patient Call back at Home Phone [phone]   Caller: Daughter Olegario Messier Summary of Call: Pt's daughter has a form from [facility] to get generic aricept. She is req a call back regarding this.  Initial call taken by: Lamar Sprinkles, CMA,  May 19, 2009 3:37 PM  Follow-up for Phone Call        ok for generic.  Follow-up by: Jacques Navy MD,  May 19, 2009 4:31 PM  Additional Follow-up for Phone Call Additional follow up Details #1::        Left vm for [facility] to bring in [facility] form Additional Follow-up by: Lamar Sprinkles, CMA,  May 19, 2009 5:22 PM    Additional Follow-up for Phone Call Additional follow up Details #2::    Spoke with Olegario Messier, pt needs rx, rx sent in  Follow-up by: Lamar Sprinkles, CMA,  May 20, 2009 8:07 AM  Prescriptions: ARICEPT 10 MG  TABS (DONEPEZIL HCL) take 1 by mouth qd  #90 x 1   Entered by:   Lamar Sprinkles, CMA   Authorized by:   Jacques Navy MD   Signed by:   Lamar Sprinkles, CMA on 05/20/2009   Method used:   Faxed to ...       [facility]* (mail-order)             ,          Ph: [phone]       Fax: [phone]   RxID:   [id number]

## 2010-06-16 NOTE — Medication Information (Signed)
Summary: rov/sp  Anticoagulant Therapy  Managed by: Weston Brass, PharmD Referring MD: Charlies Constable MD PCP: Link Snuffer MD: Eden Emms MD, Theron Arista Indication 1: Atrial Fibrillation (ICD-427.31) Lab Used: [facility] [place] Site: [facility] INR POC 2.6 INR RANGE 2-3  Dietary changes: no    Health status changes: no    Bleeding/hemorrhagic complications: no    Recent/future hospitalizations: no    Any changes in medication regimen? no    Recent/future dental: no  Any missed doses?: no       Is patient compliant with meds? yes       Allergies: No Known Drug Allergies  Anticoagulation Management History:      The patient is taking warfarin and comes in today for a routine follow up visit.  Positive risk factors for bleeding include an age of 75 years or older and presence of serious comorbidities.  The bleeding index is 'intermediate risk'.  Positive CHADS2 values include History of HTN, Age > 75 years old, and History of Diabetes.  The start date was 01/28/2003.  His last INR was 1.9.  Anticoagulation responsible provider: Eden Emms MD, Theron Arista.  INR POC: 2.6.  Cuvette Lot#: [id number].  Exp: 12/2010.    Anticoagulation Management Assessment/Plan:      The patient's current anticoagulation dose is Coumadin 5 mg  tabs: take as directed.  The target INR is 2 - 3.  The next INR is due 11/18/2009.  Anticoagulation instructions were given to patient.  Results were reviewed/authorized by Weston Brass, PharmD.  He was notified by Weston Brass PharmD.         Prior Anticoagulation Instructions: INR 2.5  Continue same dose of 1 tablet every day   Current Anticoagulation Instructions: INR 2.6  Continue same dose of 1 tablet every day.

## 2010-06-16 NOTE — Assessment & Plan Note (Signed)
Summary: per check out      Allergies Added: NKDA  Visit Type:  Follow-up Primary Provider:  [facility]ins  CC:  none.  History of Present Illness: Juan Carroll is 77 years old and returns for management of atrial fibrillation. He has chronic atrial fibrillation which is managed with rate control and Coumadin. He's had no recent symptoms of chest pain shortness of breath or palpitations.  He has documented nonobstructive coronary disease. He has had previous carotid endarterectomy. He also has hypertension.  He is Chand score 2. He is [place] Vasc score 4.  He also has mild dementia and takes medication for this.  He is an avid golf her and can shoot his age. He is currently living with his daughter and her family.  Current Medications (verified): 1)  Lanoxin 0.25 Mg  Tabs (Digoxin) .... Take 1 By Mouth Qd 2)  Altace 10 Mg  Caps (Ramipril) .... Take 1 By Mouth Qd 3)  Aricept 10 Mg  Tabs (Donepezil Hcl) .... Take 1 By Mouth Qd 4)  Coumadin 5 Mg  Tabs (Warfarin Sodium) .... Take As Directed 5)  Simvastatin 20 Mg  Tabs (Simvastatin) .... Take 1 By Mouth Qd 6)  Amaryl 1 Mg  Tabs (Glimepiride) .... Take 1 By Mouth Qd 7)  Aspirin 81 Mg  Tabs (Aspirin) .... Take 1 Tablet By Mouth Once A Day  Allergies (verified): No Known Drug Allergies  Past History:  Past Medical History: Reviewed history from 01/09/2009 and no changes required. DEGENERATIVE DISC DISEASE (ICD-722.6) CAROTID ARTERY STENOSIS, RIGHT (ICD-433.10) HYPERTENSION (ICD-401.9) INGUINAL HERNIA, RIGHT (ICD-550.90) MEMORY LOSS (ICD-780.93) PERIPHERAL VASCULAR DISEASE (ICD-443.9) DIABETES MELLITUS, TYPE II (ICD-250.00) CORONARY ARTERY DISEASE (ICD-414.00) PSA, INCREASED (ICD-790.93) ATRIAL FIBRILLATION (ICD-427.31) HYPERLIPIDEMIA (ICD-272.4) 1. Chronic atrial fibrillation, on rate control, medicines and     Coumadin. 2. Hypertension. 3. Diabetes. 4. Hyperlipidemia. 5. Carotid stenosis status post left carotid endarterectomy in  2008.    Review of Systems       ROS is negative except as outlined in HPI.   Vital Signs:  Patient profile:   77 year old male Height:      74 inches Weight:      184 pounds BMI:     23.71 Pulse rate:   54 / minute BP sitting:   122 / 74  (right arm)  Vitals Entered By: Burnett Kanaris, CNA (December 16, 2009 9:17 AM)  Physical Exam  Additional Exam:  Gen. Well-nourished, in no distress   Neck: No JVD, thyroid not enlarged, no carotid bruits Lungs: No tachypnea, clear without rales, rhonchi or wheezes Cardiovascular: Rhythm irregular, PMI not displaced,  heart sounds  normal, no murmurs or gallops, no peripheral edema, pulses normal in all 4 extremities. Abdomen: BS normal, abdomen soft and non-tender without masses or organomegaly, no hepatosplenomegaly. MS: No deformities, no cyanosis or clubbing   Neuro:  No focal sns   Skin:  no lesions    Impression & Recommendations:  Problem # 1:  ATRIAL FIBRILLATION (ICD-427.31) He has chronic atrial fibrillation. He is had no symptoms. This is managed with rate control and Coumadin and appears stable. His updated medication list for this problem includes:    Lanoxin 0.25 Mg Tabs (Digoxin) .Marland Kitchen... Take 1 by mouth qd    Coumadin 5 Mg Tabs (Warfarin sodium) .Marland Kitchen... Take as directed    Aspirin 81 Mg Tabs (Aspirin) .Marland Kitchen... Take 1 tablet by mouth once a day  Problem # 2:  HYPERTENSION (ICD-401.9) His blood pressure is well  controlled on current medications. His updated medication list for this problem includes:    Altace 10 Mg Caps (Ramipril) .Marland Kitchen... Take 1 by mouth qd    Aspirin 81 Mg Tabs (Aspirin) .Marland Kitchen... Take 1 tablet by mouth once a day  Other Orders: EKG w/ Interpretation (93000)  Patient Instructions: 1)  Your physician wants you to follow-up in: 1 year with Dr. Clifton James. You will receive a reminder letter in the mail two months in advance. If you don't receive a letter, please call our office to schedule the follow-up appointment. 2)   Your physician recommends that you continue on your current medications as directed. Please refer to the Current Medication list given to you today.

## 2010-06-16 NOTE — Medication Information (Signed)
Summary: ccr  Anticoagulant Therapy  Managed by: Cloyde Reams, RN, BSN Referring MD: Charlies Constable MD PCP: Link Snuffer MD: Gala Romney MD, Reuel Boom Indication 1: Atrial Fibrillation (ICD-427.31) Lab Used: [facility] Site: [facility] INR POC 3.2 INR RANGE 2-3  Dietary changes: no    Health status changes: no    Bleeding/hemorrhagic complications: no    Recent/future hospitalizations: no    Any changes in medication regimen? no    Recent/future dental: no  Any missed doses?: no       Is patient compliant with meds? yes       Allergies: No Known Drug Allergies  Anticoagulation Management History:      The patient is taking warfarin and comes in today for a routine follow up visit.  Positive risk factors for bleeding include an age of 75 years or older and presence of serious comorbidities.  The bleeding index is 'intermediate risk'.  Positive CHADS2 values include History of HTN, Age > 1 years old, and History of Diabetes.  The start date was 01/28/2003.  His last INR was 1.9.  Anticoagulation responsible Shaine Mount: Bensimhon MD, Reuel Boom.  INR POC: 3.2.  Cuvette Lot#: [id number].  Exp: 03/2011.    Anticoagulation Management Assessment/Plan:      The patient's current anticoagulation dose is Coumadin 5 mg  tabs: take as directed.  The target INR is 2 - 3.  The next INR is due 03/16/2010.  Anticoagulation instructions were given to patient.  Results were reviewed/authorized by Cloyde Reams, RN, BSN.  He was notified by Cloyde Reams RN.         Prior Anticoagulation Instructions: INR 2.8  Continue taking Coumadin 1 tab (5 mg) every day. Return to clinic in 4 weeks.   Current Anticoagulation Instructions: INR 3.2  Skip today's dosage the resume same dosage 5mg  daily.  Recheck in 4 weeks.

## 2010-06-16 NOTE — Medication Information (Signed)
Summary: rov/ewj  Anticoagulant Therapy  Managed by: Weston Brass, PharmD Referring MD: Charlies Constable MD PCP: Link Snuffer MD: Jens Som MD, Arlys John Indication 1: Atrial Fibrillation (ICD-427.31) Lab Used: [facility] Site: [facility] INR POC 3.5 INR RANGE 2-3  Dietary changes: no    Health status changes: no    Bleeding/hemorrhagic complications: no    Recent/future hospitalizations: no    Any changes in medication regimen? no    Recent/future dental: no  Any missed doses?: no       Is patient compliant with meds? yes       Allergies: No Known Drug Allergies  Anticoagulation Management History:      The patient is taking warfarin and comes in today for a routine follow up visit.  Positive risk factors for bleeding include an age of 2 years or older and presence of serious comorbidities.  The bleeding index is 'intermediate risk'.  Positive CHADS2 values include History of HTN, Age > 32 years old, and History of Diabetes.  The start date was 01/28/2003.  His last INR was 1.9.  Anticoagulation responsible provider: Jens Som MD, Arlys John.  INR POC: 3.5.  Cuvette Lot#: [id number].  Exp: 02/2011.    Anticoagulation Management Assessment/Plan:      The patient's current anticoagulation dose is Coumadin 5 mg  tabs: take as directed.  The target INR is 2 - 3.  The next INR is due 01/06/2010.  Anticoagulation instructions were given to patient.  Results were reviewed/authorized by Weston Brass, PharmD.  He was notified by Weston Brass PharmD.         Prior Anticoagulation Instructions: INR 2.7  Continue on same dosage 5mg  daily.  Recheck in 4 weeks.    Current Anticoagulation Instructions: INR 3.5  Skip tomorrow's dose of Coumadin then resume same dose of 1 tablet every day.  Recheck INR in 3 weeks.

## 2010-06-16 NOTE — Letter (Signed)
Summary: [facility]  [facility]   Imported By: Lester Arrey 10/21/2009 09:15:43  _____________________________________________________________________  External Attachment:    Type:   Image     Comment:   External Document

## 2010-06-16 NOTE — Medication Information (Signed)
Summary: rov/ewj  Anticoagulant Therapy  Managed by: Cloyde Reams, RN, BSN Referring MD: Charlies Constable MD PCP: Link Snuffer MD: Shirlee Latch MD, Kashonda Sarkisyan Indication 1: Atrial Fibrillation (ICD-427.31) Lab Used: [facility] Site: [facility] INR POC 2.7 INR RANGE 2-3  Dietary changes: no    Health status changes: no    Bleeding/hemorrhagic complications: no    Recent/future hospitalizations: no    Any changes in medication regimen? no    Recent/future dental: no  Any missed doses?: no       Is patient compliant with meds? yes       Allergies (verified): No Known Drug Allergies  Anticoagulation Management History:      The patient is taking warfarin and comes in today for a routine follow up visit.  Positive risk factors for bleeding include an age of 75 years or older and presence of serious comorbidities.  The bleeding index is 'intermediate risk'.  Positive CHADS2 values include History of HTN, Age > 75 years old, and History of Diabetes.  The start date was 01/28/2003.  His last INR was 1.9.  Anticoagulation responsible provider: Shirlee Latch MD, Taneika Choi.  INR POC: 2.7.  Cuvette Lot#: [id number].  Exp: 09/2010.    Anticoagulation Management Assessment/Plan:      The patient's current anticoagulation dose is Coumadin 5 mg  tabs: take as directed.  The target INR is 2 - 3.  The next INR is due 08/26/2009.  Anticoagulation instructions were given to patient.  Results were reviewed/authorized by Cloyde Reams, RN, BSN.  He was notified by Cloyde Reams RN.         Prior Anticoagulation Instructions: INR 2.4  Continue on same dosage 1 tablet daily.  Recheck in 4 weeks.    Current Anticoagulation Instructions: INR 2.7  Continue on same dosage 1 tablet daily.  Recheck in 4 weeks.

## 2010-06-16 NOTE — Medication Information (Signed)
Summary: rov/sp  Anticoagulant Therapy  Managed by: Cloyde Reams, RN, BSN Referring MD: Charlies Constable MD PCP: Link Snuffer MD: Gala Romney MD, Reuel Boom Indication 1: Atrial Fibrillation (ICD-427.31) Lab Used: [facility] Site: [facility] INR POC 2.7 INR RANGE 2-3  Dietary changes: no    Health status changes: no    Bleeding/hemorrhagic complications: no    Recent/future hospitalizations: no    Any changes in medication regimen? no    Recent/future dental: no  Any missed doses?: no       Is patient compliant with meds? yes       Allergies: No Known Drug Allergies  Anticoagulation Management History:      The patient is taking warfarin and comes in today for a routine follow up visit.  Positive risk factors for bleeding include an age of 75 years or older and presence of serious comorbidities.  The bleeding index is 'intermediate risk'.  Positive CHADS2 values include History of HTN, Age > 6 years old, and History of Diabetes.  The start date was 01/28/2003.  His last INR was 1.9.  Anticoagulation responsible provider: Awesome Jared MD, Reuel Boom.  INR POC: 2.7.  Cuvette Lot#: [id number].  Exp: 01/2011.    Anticoagulation Management Assessment/Plan:      The patient's current anticoagulation dose is Coumadin 5 mg  tabs: take as directed.  The target INR is 2 - 3.  The next INR is due 12/16/2009.  Anticoagulation instructions were given to patient.  Results were reviewed/authorized by Cloyde Reams, RN, BSN.  He was notified by Cloyde Reams RN.         Prior Anticoagulation Instructions: INR 2.6  Continue same dose of 1 tablet every day.   Current Anticoagulation Instructions: INR 2.7  Continue on same dosage 5mg  daily.  Recheck in 4 weeks.

## 2010-06-16 NOTE — Medication Information (Signed)
Summary: rov/sp  Anticoagulant Therapy  Managed by: Weston Brass, PharmD Referring MD: Charlies Constable MD PCP: Link Snuffer MD: Jens Som MD, Arlys John Indication 1: Atrial Fibrillation (ICD-427.31) Lab Used: [facility] Site: [facility] INR POC 2.5 INR RANGE 2-3  Dietary changes: no    Health status changes: no    Bleeding/hemorrhagic complications: no    Recent/future hospitalizations: no    Any changes in medication regimen? no    Recent/future dental: no  Any missed doses?: no       Is patient compliant with meds? yes       Allergies: No Known Drug Allergies  Anticoagulation Management History:      The patient is taking warfarin and comes in today for a routine follow up visit.  Positive risk factors for bleeding include an age of 75 years or older and presence of serious comorbidities.  The bleeding index is 'intermediate risk'.  Positive CHADS2 values include History of HTN, Age > 75 years old, and History of Diabetes.  The start date was 01/28/2003.  His last INR was 1.9.  Anticoagulation responsible provider: Jens Som MD, Arlys John.  INR POC: 2.5.  Cuvette Lot#: [id number].  Exp: 12/2010.    Anticoagulation Management Assessment/Plan:      The patient's current anticoagulation dose is Coumadin 5 mg  tabs: take as directed.  The target INR is 2 - 3.  The next INR is due 10/21/2009.  Anticoagulation instructions were given to patient.  Results were reviewed/authorized by Weston Brass, PharmD.  He was notified by Weston Brass PharmD.         Prior Anticoagulation Instructions: INR 2.5  Continue same dose of 1 tablet every day   Current Anticoagulation Instructions: Same as Prior Instructions.

## 2010-06-16 NOTE — Medication Information (Signed)
Summary: rov/sp   Anticoagulant Therapy  Managed by: Reina Fuse, PharmD Referring MD: Charlies Constable MD PCP: Link Snuffer MD: Eden Emms MD, Theron Arista Indication 1: Atrial Fibrillation (ICD-427.31) Lab Used: [facility] Site: [facility] INR POC 2.8 INR RANGE 2-3  Dietary changes: no    Health status changes: no    Bleeding/hemorrhagic complications: no    Recent/future hospitalizations: no    Any changes in medication regimen? no    Recent/future dental: no  Any missed doses?: no       Is patient compliant with meds? yes       Allergies: No Known Drug Allergies  Anticoagulation Management History:      The patient is taking warfarin and comes in today for a routine follow up visit.  Positive risk factors for bleeding include an age of 75 years or older and presence of serious comorbidities.  The bleeding index is 'intermediate risk'.  Positive CHADS2 values include History of HTN, Age > 45 years old, and History of Diabetes.  The start date was 01/28/2003.  His last INR was 1.9.  Anticoagulation responsible provider: Eden Emms MD, Theron Arista.  INR POC: 2.8.  Cuvette Lot#: [id number].  Exp: 02/2011.    Anticoagulation Management Assessment/Plan:      The patient's current anticoagulation dose is Coumadin 5 mg  tabs: take as directed.  The target INR is 2 - 3.  The next INR is due 02/03/2010.  Anticoagulation instructions were given to patient.  Results were reviewed/authorized by Reina Fuse, PharmD.  He was notified by Reina Fuse PharmD.         Prior Anticoagulation Instructions: INR 3.5  Skip tomorrow's dose of Coumadin then resume same dose of 1 tablet every day.  Recheck INR in 3 weeks.   Current Anticoagulation Instructions: INR 2.8  Continue taking Coumadin 1 tab (5 mg) every day. Return to clinic in 4 weeks.

## 2010-06-16 NOTE — Medication Information (Signed)
Summary: rov.mp  Anticoagulant Therapy  Managed by: [place] Dubin, PharmD Referring MD: Charlies Constable MD PCP: Link Snuffer MD: Jens Som MD, Arlys John Indication 1: Atrial Fibrillation (ICD-427.31) Lab Used: [facility] Site: [facility] INR POC 2.4 INR RANGE 2-3  Dietary changes: no    Health status changes: no    Bleeding/hemorrhagic complications: no    Recent/future hospitalizations: no    Any changes in medication regimen? no    Recent/future dental: no  Any missed doses?: no       Is patient compliant with meds? yes       Allergies (verified): No Known Drug Allergies  Anticoagulation Management History:      The patient is taking warfarin and comes in today for a routine follow up visit.  Positive risk factors for bleeding include an age of 75 years or older and presence of serious comorbidities.  The bleeding index is 'intermediate risk'.  Positive CHADS2 values include History of HTN, Age > 73 years old, and History of Diabetes.  The start date was 01/28/2003.  His last INR was 1.9.  Anticoagulation responsible provider: Jens Som MD, Arlys John.  INR POC: 2.4.  Cuvette Lot#: [id number].  Exp: 08/2010.    Anticoagulation Management Assessment/Plan:      The patient's current anticoagulation dose is Coumadin 5 mg  tabs: take as directed.  The target INR is 2 - 3.  The next INR is due 07/01/2009.  Anticoagulation instructions were given to patient.  Results were reviewed/authorized by [place] Dubin, PharmD.  He was notified by Lew Dawes, PharmD Candidate.         Prior Anticoagulation Instructions: The patient's dosage of coumadin will be increased.  The new dosage includes: 1 tab (5mg ) every day of the week.  Current Anticoagulation Instructions: INR 2.4  Continue same dose of 1 tablet daily. Recheck in 4 weeks.

## 2010-06-16 NOTE — Medication Information (Signed)
Summary: rov/ewj   Anticoagulant Therapy  Managed by: Weston Brass, PharmD Referring MD: Charlies Constable MD PCP: Link Snuffer MD: Tenny Craw MD, Gunnar Fusi Indication 1: Atrial Fibrillation (ICD-427.31) Lab Used: [facility] Site: [facility] INR POC 2.9 INR RANGE 2-3  Dietary changes: yes       Details: He says he doesn't have any idea on diet - he just eats whatever his daughter puts in front of him.    Health status changes: no    Bleeding/hemorrhagic complications: no    Recent/future hospitalizations: no    Any changes in medication regimen? no    Recent/future dental: no  Any missed doses?: no       Is patient compliant with meds? yes       Allergies: No Known Drug Allergies  Anticoagulation Management History:      The patient is taking warfarin and comes in today for a routine follow up visit.  Positive risk factors for bleeding include an age of 75 years or older and presence of serious comorbidities.  The bleeding index is 'intermediate risk'.  Positive CHADS2 values include History of HTN, Age > 44 years old, and History of Diabetes.  The start date was 01/28/2003.  His last INR was 1.9.  Anticoagulation responsible provider: Tenny Craw MD, Gunnar Fusi.  INR POC: 2.9.  Cuvette Lot#: [id number].  Exp: 03/2011.    Anticoagulation Management Assessment/Plan:      The patient's current anticoagulation dose is Coumadin 5 mg  tabs: take as directed.  The target INR is 2 - 3.  The next INR is due 04/13/2010.  Anticoagulation instructions were given to patient.  Results were reviewed/authorized by Weston Brass, PharmD.  He was notified by Haynes Hoehn, PharmD candidate.         Prior Anticoagulation Instructions: INR 3.2  Skip today's dosage the resume same dosage 5mg  daily.  Recheck in 4 weeks.    Current Anticoagulation Instructions: INR 2.9  Continue Coumadin as scheduled:  1 tablet every day of the week.  Return to clinic in 4 weeks.

## 2010-06-16 NOTE — Progress Notes (Signed)
Summary: Digoxin  Phone Note From Other Clinic   Caller: [place] - [employer] [phone] Summary of Call: Pt's insurance called, She recieved a message stating that pt was unable to get his digoxin from [facility]. Pt should still have refills, need to call [place] back to clarify.  Initial call taken by: Lamar Sprinkles, CMA,  February 16, 2010 11:01 AM  Follow-up for Phone Call        Left vm for daughter Olegario Messier to call office with any problems with medication Follow-up by: Lamar Sprinkles, CMA,  February 16, 2010 5:07 PM    Prescriptions: LANOXIN 0.25 MG  TABS (DIGOXIN) take 1 by mouth qd  #90 x 1   Entered by:   Lamar Sprinkles, CMA   Authorized by:   Tresa Garter MD   Signed by:   Lamar Sprinkles, CMA on 02/16/2010   Method used:   Faxed to ...       [facility] (mail-order)             , [place]         Ph: [phone]       Fax: [phone]   RxID:   [id number]

## 2010-06-18 NOTE — Medication Information (Signed)
Summary: rov/tp   Anticoagulant Therapy  Managed by: Geoffry Paradise, PharmD Referring MD: Charlies Constable MD PCP: Link Snuffer MD: Tenny Craw MD, Gunnar Fusi Indication 1: Atrial Fibrillation (ICD-427.31) Lab Used: [facility] Site: [facility] INR POC 3 INR RANGE 2-3  Dietary changes: no    Health status changes: no    Bleeding/hemorrhagic complications: no    Recent/future hospitalizations: no    Any changes in medication regimen? no    Recent/future dental: no  Any missed doses?: no       Is patient compliant with meds? yes       Allergies: No Known Drug Allergies  Anticoagulation Management History:      The patient is taking warfarin and comes in today for a routine follow up visit.  Positive risk factors for bleeding include an age of 34 years or older and presence of serious comorbidities.  The bleeding index is 'intermediate risk'.  Positive CHADS2 values include History of HTN, Age > 38 years old, and History of Diabetes.  The start date was 01/28/2003.  His last INR was 1.9.  Anticoagulation responsible provider: Tenny Craw MD, Gunnar Fusi.  INR POC: 3.  Cuvette Lot#: [id number].  Exp: 05/2011.    Anticoagulation Management Assessment/Plan:      The patient's current anticoagulation dose is Coumadin 5 mg  tabs: take as directed.  The target INR is 2 - 3.  The next INR is due 06/26/2010.  Anticoagulation instructions were given to patient.  Results were reviewed/authorized by Geoffry Paradise, PharmD.         Prior Anticoagulation Instructions: INR 3.2 (goal INR: 2-3)  Take 1/2 tablet today then take 1 tablet everyday.  Current Anticoagulation Instructions: INR:  3 (goal 2-3)  Your INR has been in the higher end for the past 2 visits.  Take 1/2 a tablet (2.5 mg) tonight.  Your new regimen is 1 tablet (5mg ) every day EXCEPT 1/2 a tablet (2.5 mg) on Fridays.  Return to clinic in 2 weeks for another INR check.

## 2010-06-18 NOTE — Medication Information (Signed)
Summary: rov/sp   Anticoagulant Therapy  Managed by: Weston Brass, PharmD Referring MD: Charlies Constable MD PCP: Link Snuffer MD: Jens Som MD, Arlys John Indication 1: Atrial Fibrillation (ICD-427.31) Lab Used: [facility] Site: [facility] INR POC 3.2 INR RANGE 2-3  Dietary changes: no    Health status changes: no    Bleeding/hemorrhagic complications: no    Recent/future hospitalizations: no    Any changes in medication regimen? no    Recent/future dental: no  Any missed doses?: no       Is patient compliant with meds? yes       Allergies: No Known Drug Allergies  Anticoagulation Management History:      The patient is taking warfarin and comes in today for a routine follow up visit.  Positive risk factors for bleeding include an age of 75 years or older and presence of serious comorbidities.  The bleeding index is 'intermediate risk'.  Positive CHADS2 values include History of HTN, Age > 66 years old, and History of Diabetes.  The start date was 01/28/2003.  His last INR was 1.9.  Anticoagulation responsible provider: Jens Som MD, Arlys John.  INR POC: 3.2.  Exp: 03/2011.    Anticoagulation Management Assessment/Plan:      The patient's current anticoagulation dose is Coumadin 5 mg  tabs: take as directed.  The target INR is 2 - 3.  The next INR is due 06/12/2010.  Anticoagulation instructions were given to patient.  Results were reviewed/authorized by Weston Brass, PharmD.  He was notified by Linward Headland, PharmD candidate.         Prior Anticoagulation Instructions: INR 2.9  Continue Coumadin as scheduled:  1 tablet every day of the week.  Return to clinic in 4 weeks.    Current Anticoagulation Instructions: INR 3.2 (goal INR: 2-3)  Take 1/2 tablet today then take 1 tablet everyday.

## 2010-06-26 ENCOUNTER — Encounter: Payer: Self-pay | Admitting: Cardiology

## 2010-06-26 ENCOUNTER — Encounter (INDEPENDENT_AMBULATORY_CARE_PROVIDER_SITE_OTHER): Payer: Medicare Other

## 2010-06-26 DIAGNOSIS — I4891 Unspecified atrial fibrillation: Secondary | ICD-10-CM

## 2010-06-26 DIAGNOSIS — Z7901 Long term (current) use of anticoagulants: Secondary | ICD-10-CM

## 2010-07-02 NOTE — Medication Information (Signed)
Summary: [facility]   Anticoagulant Therapy  Managed by: Windell Hummingbird, RN Referring MD: Charlies Constable MD PCP: Link Snuffer MD: Jens Som MD, Arlys John Indication 1: Atrial Fibrillation (ICD-427.31) Lab Used: [facility] Site: [facility] INR POC 3.0 INR RANGE 2-3  Dietary changes: no    Health status changes: no    Bleeding/hemorrhagic complications: no    Recent/future hospitalizations: no    Any changes in medication regimen? no    Recent/future dental: no  Any missed doses?: no       Is patient compliant with meds? yes       Allergies: No Known Drug Allergies  Anticoagulation Management History:      The patient is taking warfarin and comes in today for a routine follow up visit.  Positive risk factors for bleeding include an age of 20 years or older and presence of serious comorbidities.  The bleeding index is 'intermediate risk'.  Positive CHADS2 values include History of HTN, Age > 29 years old, and History of Diabetes.  The start date was 01/28/2003.  His last INR was 1.9.  Anticoagulation responsible provider: Jens Som MD, Arlys John.  INR POC: 3.0.  Exp: 05/2011.    Anticoagulation Management Assessment/Plan:      The patient's current anticoagulation dose is Coumadin 5 mg  tabs: take as directed.  The target INR is 2 - 3.  The next INR is due 07/24/2010.  Anticoagulation instructions were given to patient.  Results were reviewed/authorized by Windell Hummingbird, RN.  He was notified by Windell Hummingbird, RN.         Prior Anticoagulation Instructions: INR:  3 (goal 2-3)  Your INR has been in the higher end for the past 2 visits.  Take 1/2 a tablet (2.5 mg) tonight.  Your new regimen is 1 tablet (5mg ) every day EXCEPT 1/2 a tablet (2.5 mg) on Fridays.  Return to clinic in 2 weeks for another INR check.    Current Anticoagulation Instructions: INR 3.0 Continue taking 1 tablet everyday, except take 1 1/2 tablets on Fridays. Recheck in 4 weeks.

## 2010-07-07 DIAGNOSIS — I4891 Unspecified atrial fibrillation: Secondary | ICD-10-CM

## 2010-07-24 ENCOUNTER — Encounter: Payer: Self-pay | Admitting: Cardiovascular Disease

## 2010-07-24 ENCOUNTER — Encounter (INDEPENDENT_AMBULATORY_CARE_PROVIDER_SITE_OTHER): Payer: Medicare Other

## 2010-07-24 DIAGNOSIS — I4891 Unspecified atrial fibrillation: Secondary | ICD-10-CM

## 2010-07-24 DIAGNOSIS — Z7901 Long term (current) use of anticoagulants: Secondary | ICD-10-CM

## 2010-07-24 LAB — CONVERTED CEMR LAB: POC INR: 3.2

## 2010-07-28 NOTE — Medication Information (Signed)
Summary: rov/pc  Anticoagulant Therapy  Managed by: Cloyde Reams, RN, BSN Referring MD: Charlies Constable MD PCP: Link Snuffer MD: Eden Emms MD, Theron Arista Indication 1: Atrial Fibrillation (ICD-427.31) Lab Used: [facility] Site: [facility] INR POC 3.2 INR RANGE 2-3  Dietary changes: no    Health status changes: no    Bleeding/hemorrhagic complications: no    Recent/future hospitalizations: no    Any changes in medication regimen? no    Recent/future dental: no  Any missed doses?: no       Is patient compliant with meds? yes       Allergies: No Known Drug Allergies  Anticoagulation Management History:      The patient is taking warfarin and comes in today for a routine follow up visit.  Positive risk factors for bleeding include an age of 24 years or older and presence of serious comorbidities.  The bleeding index is 'intermediate risk'.  Positive CHADS2 values include History of HTN, Age > 2 years old, and History of Diabetes.  The start date was 01/28/2003.  His last INR was 1.9.  Anticoagulation responsible provider: Eden Emms MD, Theron Arista.  INR POC: 3.2.  Cuvette Lot#: [id number].  Exp: 07/2011.    Anticoagulation Management Assessment/Plan:      The patient's current anticoagulation dose is Coumadin 5 mg  tabs: take as directed.  The target INR is 2 - 3.  The next INR is due 08/21/2010.  Anticoagulation instructions were given to patient.  Results were reviewed/authorized by Cloyde Reams, RN, BSN.  He was notified by Cloyde Reams RN.         Prior Anticoagulation Instructions: INR 3.0 Continue taking 1 tablet everyday, except take 1 1/2 tablets on Fridays. Recheck in 4 weeks.  Current Anticoagulation Instructions: INR 3.2  Skip tomorrow's dosage of Coumadin, then resume same dosage 1 tablet daily except 1/2 tablet on Fridays.  Recheck in 4 weeks.

## 2010-08-21 ENCOUNTER — Encounter: Payer: Medicare Other | Admitting: *Deleted

## 2010-08-22 LAB — CBC
HCT: 43.9 % (ref 39.0–52.0)
Hemoglobin: 14.9 g/dL (ref 13.0–17.0)
Platelets: 185 10*3/uL (ref 150–400)
RBC: 4.53 MIL/uL (ref 4.22–5.81)
WBC: 7.6 10*3/uL (ref 4.0–10.5)

## 2010-08-22 LAB — COMPREHENSIVE METABOLIC PANEL
Albumin: 3.7 g/dL (ref 3.5–5.2)
Alkaline Phosphatase: 83 U/L (ref 39–117)
BUN: 16 mg/dL (ref 6–23)
Chloride: 103 mEq/L (ref 96–112)
Glucose, Bld: 105 mg/dL — ABNORMAL HIGH (ref 70–99)
Potassium: 4.9 mEq/L (ref 3.5–5.1)
Total Bilirubin: 1.2 mg/dL (ref 0.3–1.2)

## 2010-08-22 LAB — DIFFERENTIAL
Basophils Absolute: 0 10*3/uL (ref 0.0–0.1)
Basophils Relative: 0 % (ref 0–1)
Monocytes Absolute: 0.8 10*3/uL (ref 0.1–1.0)
Neutro Abs: 5 10*3/uL (ref 1.7–7.7)

## 2010-08-22 LAB — GLUCOSE, CAPILLARY
Glucose-Capillary: 105 mg/dL — ABNORMAL HIGH (ref 70–99)
Glucose-Capillary: 115 mg/dL — ABNORMAL HIGH (ref 70–99)

## 2010-08-23 LAB — COMPREHENSIVE METABOLIC PANEL
ALT: 28 U/L (ref 0–53)
Albumin: 4.4 g/dL (ref 3.5–5.2)
Alkaline Phosphatase: 90 U/L (ref 39–117)
BUN: 29 mg/dL — ABNORMAL HIGH (ref 6–23)
Potassium: 4.1 mEq/L (ref 3.5–5.1)
Sodium: 136 mEq/L (ref 135–145)
Total Protein: 7 g/dL (ref 6.0–8.3)

## 2010-08-23 LAB — DIFFERENTIAL
Basophils Relative: 0 % (ref 0–1)
Lymphs Abs: 1.5 10*3/uL (ref 0.7–4.0)
Monocytes Absolute: 0.6 10*3/uL (ref 0.1–1.0)
Monocytes Relative: 7 % (ref 3–12)
Neutro Abs: 6.4 10*3/uL (ref 1.7–7.7)

## 2010-08-23 LAB — CBC
Platelets: 201 10*3/uL (ref 150–400)
RDW: 13.4 % (ref 11.5–15.5)

## 2010-08-25 ENCOUNTER — Other Ambulatory Visit: Payer: Self-pay | Admitting: *Deleted

## 2010-08-25 ENCOUNTER — Ambulatory Visit (INDEPENDENT_AMBULATORY_CARE_PROVIDER_SITE_OTHER): Payer: Medicare Other | Admitting: *Deleted

## 2010-08-25 DIAGNOSIS — Z7901 Long term (current) use of anticoagulants: Secondary | ICD-10-CM

## 2010-08-25 DIAGNOSIS — I4891 Unspecified atrial fibrillation: Secondary | ICD-10-CM

## 2010-08-25 LAB — POCT INR: INR: 2.2

## 2010-08-25 MED ORDER — DIGOXIN 250 MCG PO TABS: 250.0000 ug | ORAL_TABLET | Freq: Every day | ORAL | Status: DC

## 2010-08-25 NOTE — Progress Notes (Signed)
Daughter aware of rf's

## 2010-09-22 ENCOUNTER — Encounter: Payer: Medicare Other | Admitting: *Deleted

## 2010-09-29 NOTE — Assessment & Plan Note (Signed)
[facility]                            CARDIOLOGY OFFICE NOTE   Juan Carroll, Juan Carroll                      MRN:          [id number]  DATE:11/14/2007                            DOB:          31-Jul-1927    PRIMARY CARE PHYSICIAN:  Rosalyn Gess. Norins, MD   PAST MEDICAL HISTORY:  Juan Carroll is 77 years old who returns for  followup and management of his atrial fibrillation.  He has chronic  atrial fibrillation and has been managed with rate control on Coumadin.  He also has nonobstructive coronary disease and has a right carotid  stenosis.   He has been doing well from the standpoint of his heart.  He has had no  palpitations and no chest pain or shortness of breath.  He has been  having a great deal of difficulty with his memory.  He lives with his  daughter and he has a son who he plays golf with.  Golf is his main  activity.  He usually plays at [facility].   PAST MEDICAL HISTORY:  1. Hypertension.  2. Right carotid stenosis.  3. Diabetes.  4. Hyperlipidemia.  5. He also had minimal nonobstructive disease with catheterization.   CURRENT MEDICATIONS:  1. Lanoxin 0.25 mg daily.  2. Altace 10 mg daily.  3. Aricept 10 mg daily.  4. Coumadin.  5. Simvastatin 20 mg daily.  6. Amaryl 1 mg daily.  7. Aspirin 81 mg daily.   PHYSICAL EXAMINATION:  VITALS:  Blood pressure is 115/73 and pulse 91  and irregular.  HEENT:  There was no venous distention.  The carotid pulses were full  without bruits.  CHEST:  Clear.  CARDIAC:  Rhythm is regular.  No murmurs or gallops.  ABDOMEN:  Soft with normal bowel sounds.  Peripheral pulses were full  with no peripheral edema.   An electrocardiogram showed atrial fibrillation with a controlled rate.  There was poor R-wave progression.   IMPRESSION:  1. Chronic atrial fibrillation, on rate control, medicines and      Coumadin.  2. Hypertension.  3. Diabetes.  4. Hyperlipidemia.  5. Carotid stenosis status  post left carotid endarterectomy in 2008.  6. Memory deficit.   RECOMMENDATIONS:  I think Mr. Myer is doing relatively well.  We will  leave his medicines the same.  We will get a CBC and BMP today.  I will  review his carotid stenosis data.   ADDENDUM:  Juan Carroll  has had some swelling and discomfort in his  right groin on examination.  He has a moderate-sized inguinal hernia.  I  told him that there was no urgency to treat that surgically, but if his  symptoms became worse then that might be needed.  We will have Dr.  Debby Bud reevaluate it when he sees him on his next office visit.      Juan Elvera Lennox Juanda Chance, MD, [facility]  Electronically Signed     Everardo Beals. Juanda Chance, MD, [facility]  Electronically Signed   BRB/MedQ  DD: 11/14/2007  DT: 11/15/2007  Job #: [id number]

## 2010-09-29 NOTE — Op Note (Signed)
NAME:  Juan Carroll, Juan Carroll NO.:  [id number]   MEDICAL RECORD NO.:  [id number]          PATIENT TYPE:  INP   LOCATION:  3315                         FACILITY:  [facility]   PHYSICIAN:  Balinda Quails, M.D.    DATE OF BIRTH:  [birth year]   DATE OF PROCEDURE:  03/07/2007  DATE OF DISCHARGE:                               OPERATIVE REPORT   SURGEON:  Denman George, MD.   ASSISTANT:  Billee Cashing, RNFA.   ANESTHETIC:  General endotracheal.   PREOPERATIVE DIAGNOSIS:  Severe left internal carotid artery stenosis.   POSTOPERATIVE DIAGNOSIS:  Severe left internal carotid artery stenosis.   PROCEDURE:  Left carotid endarterectomy Dacron patch angioplasty.   CLINICAL NOTE:  [name] is a 77 year old gentleman with a  history of known left carotid stenosis.  This has been asymptomatic.  It  now reveals severe stenosis by Doppler and CT angiogram.  The patient is  consented for left carotid endarterectomy for reduction of stroke risk.  The risks of the operative procedure were explained to the patient in  detail with major morbidity and mortality 1-2% to include but not  limited to MI, CVA, cranial nerve injury and death.   OPERATIVE PROCEDURE:  The patient was brought to the operating room in  stable hemodynamic condition. He was placed under general endotracheal  anesthesia.  A Foley catheter arterial line in place.  The left neck  prepped and draped in a sterile fashion.   A curvilinear skin incision made along the anterior border of the left  sternomastoid muscle.  Dissection carried down through the subcutaneous  tissue with electrocautery. Deep dissection carried through the  platysma.  The left facial vein ligated with 3-0 silk and divided.  The  carotid bifurcation exposed.  The hypoglossal nerve identified along  with the vagus nerve and both were preserved.   The common carotid artery mobilized down to the omohyoid muscle and  encircled with a Vessiloop.  Distal dissection carried up to the origin  of the superior thyroid and external carotid which were freed and  encircled with Vessiloops.  The internal carotid artery followed  distally up to the posterior belly of the digastric muscle encircled  with a Vessiloop.   Evaluation of the carotid bifurcation verified plaque extending from the  common carotid through the bulb and into the origin of both the internal  and external carotid arteries.  The plaque terminated beyond the origin  of left internal carotid artery where there was a soft small-caliber  vessel.   The patient administered 7000 units of heparin intravenously.  Adequate  circulation time permitted.  The carotid vessels controlled with clamps.  Longitudinal arteriotomy made in the distal common carotid artery.  The  arteriotomy extended across the carotid bulb and up into the internal  carotid artery. The plaque did note to feather out distally.   A shunt was inserted.   An endarterectomy elevator used to remove the plaque.  The  endarterectomy carried down to the common carotid artery, the plaque was  divided transversely with Potts scissors.  The plaque  then raised up  into the vulva and superior thyroid and external carotid were  endarterectomized using the eversion technique.  The distal internal  carotid artery plaque feathered out well.  Fragments of plaque removed  with fine forceps.  The site irrigated with heparin saline solution.   A patch angioplasty endarterectomy site carried out with a running 6-0  Prolene suture and a finesse Dacron patch.  At completion of the patch  angioplasty, the shunt removed, all vessels well flushed.  The clamps  were removed directing the initial antegrade flow up the external  carotid artery, following this the internal carotid was released.  There  is an excellent pulse and Doppler signal in the distal internal carotid  artery.  Adequate hemostasis obtained.  Sponge and  instrument counts  correct.   The sternomastoid fascia closed with running 2-0 Vicryl suture.  Platysma closed with running 3-0 Vicryl suture.  The skin closed with 4-  0 Monocryl.  Dermabond applied.   The patient awakened readily.  Moved all extremities to command.  Transferred to the recovery room in stable condition.      Balinda Quails, M.D.  Electronically Signed     PGH/MEDQ  D:  03/07/2007  T:  03/08/2007  Job:  [id number]   cc:   Rosalyn Gess. Norins, MD

## 2010-09-29 NOTE — Op Note (Signed)
NAME:  Juan Carroll, Juan Carroll               ACCOUNT NO.:  [id number]   MEDICAL RECORD NO.:  [id number]          PATIENT TYPE:  AMB   LOCATION:  DSC                          FACILITY:  [facility]   PHYSICIAN:  Currie Paris, M.D.DATE OF BIRTH:  1927/07/24   DATE OF PROCEDURE:  12/26/2008  DATE OF DISCHARGE:                               OPERATIVE REPORT   PREOPERATIVE DIAGNOSIS:  Recurrent right inguinal hernia, probably  indirect.   POSTOPERATIVE DIAGNOSIS:  Recurrent indirect right inguinal hernia.   PROCEDURE:  Repair with mesh.   SURGEON:  Currie Paris, MD   ANESTHESIA:  General.   CLINICAL HISTORY:  This is a 77 year old gentleman who has got a right  inguinal hernia that he wished to have repaired.   DESCRIPTION OF PROCEDURE:  The patient was seen in the holding area and  we confirmed the right side as the operative side and both initialed it.   The patient had no further questions.  He was then taken to the  operating room and after satisfactory general anesthesia had been  obtained, the right inguinal area was clipped, prepped, and draped.  The  time-out was done.   I infiltrated some 0.25% Marcaine to help with the postop analgesia  along the old scar as well as below the fascia at the anterior superior  iliac spine.  I reopened the old scar.  There was a fairly well-defined  layer scar tissue at Scarpa's, which was divided and the external  oblique identified.  It was opened and I could free it up from the  underlying tissue and opened it down towards the superficial ring.  I  eventually decided to divide the ilioinguinal nerve as it was really  tethered into the scar tissue, and I thought was likely to produce a  neuralgias following the repair so I divided it at the deep ring.   I was able to dissect up the cord very medially and get around it,  elevated off the inguinal floor.  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  There was a large indirect  ring with the sac coming through this, and so he therefore had an  indirect inguinal hernia as the recurrence.   I opened the sac and stripped it off the cord down to its neck and then  amputated it with a suture ligature and retracted neatly under the deep  ring.  I had the cord fairly well skeletonized.   I put one 2-0 Prolene suture just lateral to the cord to close the deep  ring a little bit and then another one just medial, so we had a snug  deep ring.  I then put a piece of Marlex mesh cut to fit, sutured it  with a running suture starting at the pubic tubercle and running from  there to the level of the deep ring inferiorly.  I then laid it up  nicely on the internal oblique, which had been elevated off  the old scar  tissue and external oblique.  The lateral aspect was cut to go around  the cord and tack laterally.   I put more Marcaine in to help with postop analgesia.  I made sure  everything was dry and then closed the external oblique over the repair  with 3-0 Vicryl, Scarpa's with 3-0 Vicryl, and the skin with 4-0  Monocryl, subcuticular, plus Dermabond.   The patient tolerated the procedure well.  There were no operative  complications.  All counts were correct.      Currie Paris, M.D.  Electronically Signed     CJS/MEDQ  D:  12/26/2008  T:  12/26/2008  Job:  [id number]   cc:   Rosalyn Gess. Norins, MD

## 2010-09-29 NOTE — Assessment & Plan Note (Signed)
OFFICE VISIT   Juan Carroll, Juan Carroll  DOB:  04-28-1928                                       03/23/2007  ZOXWR#:[id number]   [name] is postop left carotid endarterectomy carried out 03/07/2007  at [facility].  Uneventful procedure, discharged home in good  condition.  Since discharge he has no major complaints.   Left neck incision healing well.  BP is 146/80, pulse is 80 and respirations 18 per minute.  O2 SAT 97%.   He has restarted his Coumadin and taking a baby aspirin daily.   Juan Carroll is doing well following his surgery.  We will plan follow up with  him again in 6 months with a routine carotid Doppler.   Balinda Quails, M.D.  Electronically Signed   PGH/MEDQ  D:  04/11/2007  T:  04/11/2007  Job:  [id number]   cc:   Rosalyn Gess. Norins, MD

## 2010-09-29 NOTE — Assessment & Plan Note (Signed)
[facility]                            CARDIOLOGY OFFICE NOTE   NICOLO, TOMKO                      MRN:          [id number]  DATE:11/02/2006                            DOB:          01/06/1928    PRIMARY CARE PHYSICIAN:  Rosalyn Gess. Norins, M.D.   PAST MEDICAL HISTORY:  Mr. Luiz is 77 years old and returns for  followup management of his atrial fibrillation.  He has had paroxysmal  atrial fibrillation, which has been managed with rate control and  Coumadin.  He also has nonobstructive coronary disease.  He says he has  been doing quite well from the standpoint of his heart, and has had no  chest pain, shortness of breath, or palpitations.   His daughter came with him and says that he is still having difficulty  from memory problems, but this appears to be stable.  He saw Dr. Debby Bud  just yesterday.  He is still able to drive and get to the golf course,  and get to the [facility] without problems as long as his routine  is structured.   PAST MEDICAL HISTORY:  Significant for hypertension and a right carotid  stenosis, in addition to the memory difficulties.   CURRENT MEDICATIONS:  Lanoxin.  Altace.  Aricept.  Coumadin.  Simvastatin.  Amaryl.   EXAMINATION:  Blood pressure is 144/80, pulse 80 and regular.  There was no venous distension.  The carotid pulses were full with a  short right carotid bruit.  CHEST:  Clear.  CARDIAC:  Rhythm was irregular.  I could hear no murmurs or gallops.  ABDOMEN:  Soft with normal bowel sounds.  There was no  hepatosplenomegaly.  The peripheral pulses were full and equal and there was no peripheral  edema.   ELECTROCARDIOGRAM:  Shows atrial fibrillation with a ventricular rate of  80.  There was a poor R wave progression and minor nonspecific ST-T  changes.   IMPRESSION:  1. Paroxysmal atrial fibrillation on rate control and Coumadin.  2. Hypertension.  3. Minimal nonobstructive coronary  artery disease at catheterization.  4. Short-term memory difficulty.  5. Diabetes.  6. Hyperlipidemia.  7. Right carotid stenosis.   RECOMMENDATIONS:  I think Mr. Tribbey is doing well.  He has not had a  lipid profile and a CHEM-7, so we will get that and a CBC today.  He has  not had carotid studies since 2004, so we will arrange for him to have  those as well.  I will plan to see him back in followup in a year.     Bruce Elvera Lennox Juanda Chance, MD, [facility]  Electronically Signed    BRB/MedQ  DD: 11/02/2006  DT: 11/02/2006  Job #: [id number]   cc:   Rosalyn Gess. Norins, MD

## 2010-09-29 NOTE — Consult Note (Signed)
VASCULAR SURGERY CONSULTATION   EMERALD, GEHRES D  DOB:  28-Jan-1928                                       12/08/2006  ZHYQM#:[id number]   PRIMARY CARE PHYSICIAN:  Rosalyn Gess. Norins, M.D.   REASON FOR CONSULTATION:  Extracranial cerebrovascular occlusive disease  with severe left internal carotid artery stenosis.   HISTORY:  [name] is a 77 year old male, recently seen by Dr. Juanda Chance  with a left carotid bruit.  Doppler evaluation reveals severe left  internal carotid artery stenosis.  He has no history of stroke or  transient ischemic attack.  Specifically, he denies sensory, motor or  visual deficit.  No speech problems.  Denies gait abnormality.  No  syncope or presyncope.   PAST MEDICAL HISTORY:  1. Paroxysmal atrial fibrillation.  2. Hypertension.  3. Nonobstructive coronary artery disease.  4. Short-term memory loss.  5. Type 2 diabetes.  6. Hyperlipidemia.   MEDICATIONS:  1. Aricept 10 mg daily.  2. Ramipril 10 mg daily.  3. Glimepiride 1 mg daily.  4. Coumadin 5 mg Tuesday, Wednesday, Thursday; 7.5 mg Monday, Friday.  5. Simvastatin 20 mg daily.  6. Lanoxin one tablet daily.   ALLERGIES:  None known.   FAMILY HISTORY:  Mother died in her seventies of complications of old  age.  Father died at age 50.  He has one brother who died in his fifties  from Parkinson's disease.  Another sister, who died in her sixties.   SOCIAL HISTORY:  The patient is widowed.  He has two children.  He is a  retired Pharmacist, community.  Does not smoke or use alcohol  products.  Discontinued tobacco use approximately 30 years ago.   REVIEW OF SYSTEMS:  Denies weight-loss or anorexia.  No fever or chills.  No shortness of breath or chest pain.  No cough or sputum production.  No change in bowel habits.  Denies urinary tract symptoms.  No lower  extremity pain.  He does have joint discomfort.   PHYSICAL EXAMINATION:  Generally, a well-appearing, 77 year old  male,  alert and oriented.  Vital signs:  BP 166/87, left arm; 163/70, right  arm.  Pulse is 60 per minute and irregular.  Respirations 18 per minute.  HEENT:  Extraocular movements intact.  Normocephalic.  Mouth and throat  are clear.  Neck:  Supple.  No thyromegaly or adenopathy.  Cardiovascular:  Normal heart sounds, without murmurs.  Bilateral  carotid bruits.  Chest is clear.  Abdomen is soft, nontender.  No masses  or organomegaly.  Neurologic:  Cranial nerves intact.  Strength equal  bilaterally.  Two-plus reflexes.   INVESTIGATIONS:  Carotid Doppler evaluation, November 15, 2006, at [facility] reveals 60-79% right ICA stenosis, 80-99% left ICA stenosis.  Velocities, left ICA:  554/152 cm per second.   IMPRESSION:  1. Severe asymptomatic left internal carotid artery stenosis.  2. Type 2 diabetes.  3. Paroxysmal atrial fibrillation.  4. Hypertension.  5. Nonobstructive coronary disease.  6. Hyperlipidemia.   RECOMMENDATIONS:  Patient is referred for CT angiogram of the neck.  Will return with results for final decision regarding management of  carotid disease.   Balinda Quails, M.D.  Electronically Signed  PGH/MEDQ  D:  12/08/2006  T:  12/09/2006  Job:  [id number]   cc:   Rosalyn Gess. Norins, MD

## 2010-09-29 NOTE — H&P (Signed)
[name], [name]               ACCOUNT NO.:  [id number]   MEDICAL RECORD NO.:  [id number]          PATIENT TYPE:  INP   LOCATION:  2899                         FACILITY:  [facility]   PHYSICIAN:  Balinda Quails, M.D.    DATE OF BIRTH:  [birth year]   DATE OF ADMISSION:  03/07/2007  DATE OF DISCHARGE:                              HISTORY & PHYSICAL   ADMISSION DIAGNOSIS:  Severe left internal carotid artery stenosis.   HISTORY.:  [name] is a 77 year old male who was referred by  Dr. Juanda Chance with abnormal carotid Dopplers.  Doppler evaluation revealed  a severe left internal carotid artery stenosis.  No history of stroke or  transient ischemic attack.  He denies sensory, motor or visual deficit.  No speech problems.  Denies gait abnormality.  No syncope or presyncope.   Mr. Jaquith underwent a follow-up CT angiogram which did verify severe  stenosis of his left internal carotid artery.   He is on long-term Coumadin, this was discontinued 5 days prior to  surgery.  He has been supplementing his Coumadin with a baby aspirin  daily, this was increased to 325 mg daily at the time of discontinuation  of his Coumadin.   PAST MEDICAL HISTORY:  1. Paroxysmal atrial fibrillation.  2. Hypertension.  3. Nonobstructive coronary artery disease.  4. Short-term memory loss.  5. Type 2 diabetes.  6. Hyperlipidemia.   MEDICATIONS:  1. Glimepiride 2 mg 1/2 tablet daily  2. Lanoxin  0.25 mg daily  3. Ramipril 10 mg daily  4. Aricept 10 mg daily  5. Coumadin 5 mg one and a half tablets Monday, Wednesday, Friday 1      tablet Tuesday, Thursday, Saturday, Sunday.  6. Simvastatin 20 mg daily  7. Aspirin 81 mg daily.   ALLERGIES:  None known.   FAMILY HISTORY:  The patient's mother died in her 50s of complications  of old age.  Father died at age 71.  He has one brother who died in his  49s from Parkinson's disease.  Another sister died in her 64s.   SOCIAL HISTORY:  The patient is  widowed.  He has two children.  He is  retired Psychologist, prison and probation services.  Does not smoke or use alcohol products.  Discontinued tobacco use approximately 30 years ago.   REVIEW OF SYSTEMS:  Denies weight loss or anorexia.  No fever or chills.  No shortness of breath or chest pain.  No cough or sputum production.  No change in bowel habits.  Denies urinary tract symptoms.  No lower  extremity pain.  He does have joint discomfort.   PHYSICAL EXAMINATION:  Generally well appearing 77 year old male.  He  does have obvious short-term memory loss.  Vital Signs: BP 160/80, pulse  60 per minute, respirations 18 per minute.  HEENT: Extraocular movements intact.  Normocephalic.  Mouth and throat  clear.  NECK: Supple.  No thyromegaly or adenopathy.  CARDIOVASCULAR: Normal heart sounds without murmurs.  Bilateral carotid  bruits.  CHEST: Clear without rales or rhonchi.  ABDOMEN: Soft and nontender.  No mass or organomegaly.  NEUROLOGIC:  Cranial nerves intact.  Strength equal bilaterally.  2+  reflexes. .   INVESTIGATIONS:  Carotid Doppler carried out in July of this year  revealed a 60-70% right ICA stenosis and 80-99% left ICA stenosis.   CT angiogram verified a severe left internal carotid artery stenosis.   IMPRESSION:  1. Severe asymptomatic left internal carotid artery stenosis.  2. Type 2 diabetes.  3. Paroxysmal atrial fibrillation.  4. Hypertension  5. Nonobstructive coronary artery disease.  6. Hyperlipidemia.  7. Short-term memory loss.   RECOMMENDATION:  The patient will be admitted to [facility] for  elective left carotid endarterectomy.  Risks of the operative procedure  reviewed with the patient and family with a major morbidity mortality 1-  2% to include but not limited to MI, CVA, cranial nerve injury and  death.      Balinda Quails, M.D.  Electronically Signed     PGH/MEDQ  D:  03/07/2007  T:  03/07/2007  Job:  [id number]

## 2010-09-29 NOTE — Discharge Summary (Signed)
NAME:  Juan Carroll, Juan Carroll               ACCOUNT NO.:  [id number]   MEDICAL RECORD NO.:  [id number]          PATIENT TYPE:  INP   LOCATION:  3315                         FACILITY:  [facility]   PHYSICIAN:  Balinda Quails, M.D.    DATE OF BIRTH:  10/03/1927   DATE OF ADMISSION:  03/07/2007  DATE OF DISCHARGE:  03/08/2007                               DISCHARGE SUMMARY   ADMISSION DIAGNOSIS:  Severe left internal carotid artery stenosis,  asymptomatic.   DISCHARGE/SECONDARY DIAGNOSES:  1. Severe left internal carotid artery stenosis, asymptomatic, status      post left carotid endarterectomy.  2. Paroxysmal atrial fibrillation with history of Coumadin therapy.  3. Hypertension.  4. Nonobstructive coronary artery disease.  5. Short-term memory loss.  6. Diabetes mellitus type 2.  7. Hyperlipidemia.   ALLERGIES:  No known drug allergies.   PROCEDURES:  On March 07, 2007, left carotid endarterectomy with  Dacron patch angioplasty by Dr. Denman George.   BRIEF HISTORY:  Juan Carroll is a 77 year old Caucasian male referred to  Dr. Madilyn Fireman by Dr. Juanda Chance was abnormal carotid Dopplers.  Doppler  evaluation revealed severe left internal carotid artery stenosis.  He  had no history of stroke or transient ischemic attacks.  He denies  sensory, motor, visual deficits, speech problems or gait abnormality.  He has had no syncope or presyncope.  He underwent a follow-up CT  angiogram which did verify severe stenosis of his left internal carotid  artery.  He has been on long-term Coumadin which was discontinued 5 days  prior to surgery, and his aspirin was temporarily increased at that  time.  Carotid Doppler carried out in July of this year revealed 60-70%  right internal carotid artery stenosis and 80-99% left internal carotid  artery stenosis.  Dr. Madilyn Fireman recommended proceeding with left carotid  endarterectomy to reduce his risk for future stroke.   HOSPITAL COURSE:  Juan Carroll was electively  admitted to [facility] on March 07, 2007.  He underwent the previously mentioned  procedure.  He was extubated, neurologically intact, and after a short  stay in the recovery unit, was transferred to step-down unit 3300 where  he remained until discharge.  At the time of this dictation, he has not  had any uneventful postoperative course.  His vitals have been stable.  He has had some sinus bradycardia with first-degree AV block; however,  this was his baseline, as well.  He has been afebrile, saturating 98% on  room air.  He is voiding following removal of the Foley catheter.  Pain  has been controlled.  He has denied dysphagia.  He has ambulating  independently.  Labs show a white count of 8.8, hemoglobin 12.7,  hematocrit 36.6, platelet count 170.  Sodium 137, potassium 4.1, BUN of  13, creatinine 1.05 and blood glucose of 100.  On exam, his heart had a  regular rate and rhythm.  Lungs diminished but clear.  Abdominal exam  was benign.  Neurologically, he was intact.  Tongue was midline.  He did  appear to have minimal left mandibular branch  nerve palsy.  His incision  was healing well without signs of hematoma.  By morning, it was felt Mr.  Carroll had met criteria for discharge home with plans to discharge him  on March 08, 2007, postoperative day #1.  Currently, he remains in  stable condition.   DISCHARGE MEDICATIONS:  1. Oxycodone 5 mg 1-2 tablets p.o. q.4h. p.r.n. pain.  2. Glimepiride 2 mg 1/2 tablet daily.  3. Lanoxin 0.25 mg daily.  4. Altace 10 mg daily.  5. Aricept 10 mg daily.  6. Coumadin 5 mg 1-1/2 tablets on Monday, Wednesday and Friday, and 1      tablet on Tuesdays, Thursdays, Saturdays and Sundays.  7. Simvastatin 20 mg each p.m.  8. Aspirin 81 mg p.o. q.a.m.   DISCHARGE INSTRUCTIONS:  He will continue a diabetic appropriate diet.  May shower starting March 09, 2007, and may clean the incision gently  with soap and water.  Should avoid driving or  heavy lifting for the next  2 weeks.  Should call if he develops fever greater than 101 or redness  or drainage from his incision or change in his neurological status.  He  will see Dr. Madilyn Fireman in approximately 2 weeks.  Our office will contact  him regarding a specific appointment date and time.  He will also call  Dr. Regino Schultze office later today to schedule a PT/INR follow-up  appointment within the next week.      Jerold Coombe, P.A.      Balinda Quails, M.D.  Electronically Signed    AWZ/MEDQ  D:  03/08/2007  T:  03/08/2007  Job:  [id number]   cc:   Rosalyn Gess. Norins, MD  Everardo Beals. Juanda Chance, MD, [facility]

## 2010-09-29 NOTE — Assessment & Plan Note (Signed)
OFFICE VISIT   Juan Carroll, Juan Carroll  DOB:  08-05-1927                                       01/12/2007  VZDGL#:[id number]   The patient returns to the office today with the results of his CT  angiogram.  This does verify a severe left internal carotid artery  stenosis.  He was seen today with his daughter and recommendation given  that he undergo left carotid endarterectomy.  They are agreeable to  this.  Risks of the operative procedure explained in detail with a major  morbidity mortality 1% to 2% to include but not limited, MI, CVA,  cranial nerve injury and death.   The patient is on Coumadin, and this will be discontinued 5 days prior  to surgery.  Surgery is scheduled for 03/06/2007.  He will begin  supplementing Coumadin with a baby aspirin at this time.  When the  Coumadin is discontinued, he is going to his aspirin to 325 mg daily.   Balinda Quails, M.D.  Electronically Signed   PGH/MEDQ  D:  01/12/2007  T:  01/14/2007  Job:  [id number]   cc:   Rosalyn Gess. Norins, MD

## 2010-09-29 NOTE — Assessment & Plan Note (Signed)
 HEALTHCARE                            CARDIOLOGY OFFICE NOTE   [name], [name]                      MRN:          [id number]  DATE:11/14/2007                            DOB:          19-Jun-1927    ADDENDUM   Juan Carroll  has had some swelling and discomfort in his right groin  on examination.  He has a moderate-sized inguinal hernia.  I told him  that there was no urgency to treat that surgically, but if his symptoms  became worse then that might be needed.  We will have Dr. Debby Bud  reevaluate it when he sees him on his next office visit.     Bruce Elvera Lennox Juanda Chance, MD, [facility]     BRB/MedQ  DD: 11/14/2007  DT: 11/15/2007  Job #: [id number]

## 2010-10-02 NOTE — Assessment & Plan Note (Signed)
[facility]                             PRIMARY CARE OFFICE NOTE   Juan Carroll                      MRN:          [id number]  DATE:11/26/2005                            DOB:          Aug 05, 1927    [name] is a very pleasant 77 year old gentleman who presents for follow-  up evaluation and examination.  He is followed for PAF and hyperlipidemia.  Patient was last seen in the office October 15, 2005 by Dr. Charlies Constable who  thought in regards to paroxysmal atrial fibrillation that he was stable and  doing well and would continue on his regimen of Lanoxin and Coumadin.  The  patient's last full physical examination was Sep 18, 2004.   Patient reports he has no active complaints at this time.   PAST MEDICAL HISTORY:   SURGICAL:  1.  Hemorrhoidectomy, remote.  2.  Herniorrhaphy, remote.   MEDICAL:  1.  Patient had the usual childhood diseases.  2.  Asymptomatic PAF.  3.  Hyperlipidemia.  4.  Elevated PSA in the past.  5.  Dementia.   CURRENT MEDICATIONS:  1.  Lanoxin 0.25 mg daily.  2.  Altace 10 mg daily.  3.  Aricept 10 mg daily.  4.  Coumadin as directed.  5.  Simvastatin 20 mg daily.   FAMILY HISTORY:  Mother died of a stroke at 45.  Father lived to be 43.   SOCIAL HISTORY:  Patient was married for 47 years.  He has grown daughter,  grown son, several grandchildren.  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  He does continue to play  a lot of golf.  The patient does have a full-time companion.  He also has  been traveling and recently [place] got [place] from [place].   REVIEW OF SYSTEMS:  Patient has had no fevers, sweats, chills, no  cardiovascular, respiratory, GI, or GI complaints.  Neurologically the  patient feels he is doing well.  He tolerates his Aricept.  He reports he  was late today because he could not find his car keys.   PHYSICAL EXAMINATION:  VITAL SIGNS:  Temperature  is 97.2, blood pressure is  138/74, pulse 62, weight 219, height 6 feet 2 inches.  GENERAL APPEARANCE:  This is a tall, lanky gentleman looking younger than  his stated chronologic age in no acute distress.  HEENT:  Normocephalic, atraumatic.  EAC on the right with cerumen impaction.  The left EAC was clear.  TM was normal.  Oropharynx without buccal lesions.  Posterior pharynx was clear.  Conjunctiva and sclera was clear.  Pupils are  equal, round, and reactive to light and accommodation.  Funduscopic  examination revealed normal disk margins.  No vascular abnormalities were  noted.  NECK:  Supple without thyromegaly.  NODES:  No adenopathy was noted in the cervical or supraclavicular regions.  CHEST:  No CVA tenderness.  LUNGS:  Clear to auscultation, percussion.  CARDIOVASCULAR:  2+ radial pulse.  No JVD.  No carotid bruits.  He had a  quiet precordium with  regular rate and rhythm without murmurs, rubs, or  gallops.  ABDOMEN:  Soft.  No guarding.  No rebound.  No organosplenomegaly was noted.  RECTAL:  Normal sphincter tone was noted.  Prostate was smooth, normal size  and contour without nodules.  EXTREMITIES:  Without clubbing, cyanosis, edema, deformity.  NEUROLOGIC:  Patient is oriented to person, place, time, context.  Formal  testing for Mini Mental Status examination was not performed.   ASSESSMENT AND PLAN:  1.  Hypertension.  Patient's blood pressure is adequately controlled with      present medications.  He will continue the same.  2.  Paroxysmal atrial fibrillation.  Patient is in normal sinus rhythm at      this examination.  Cardiogram was not performed.  Plan:  Patient to      continue with Lanoxin and Coumadin.  3.  Hyperlipidemia.  The patient did have a recent lipid panel October 18, 2005      which showed a total cholesterol 211, triglycerides 318, HDL was 41.6,      LDL was 115.  TSH at that time was normal at 21.16.  Plan:  Patient to      continue with  simvastatin at this time.  4.  Non-insulin-dependent diabetes.  Patient is on lifestyle management      only.  Last hemoglobin A1c was November 06, 2004 and was 6.4%.  Patient is      sent to laboratory for follow-up today.  5.  Neurologic.  Patient is tolerating his Aricept well.  He does not have      appeared to change on informal examination, although formal Mini Mental      Status examination was not conducted.  Plan:  Patient to continue on      Aricept.  6.  Health maintenance.  Patient last had colonoscopy October 30, 2002.  This      was a study with colon polyps, diverticulosis, and internal hemorrhoids.      Pathology report revealed the patient to have had tubular adenoma.  He      will be recalled per gastrointestinal protocol.   CHART REVIEW:  Patient's last rest stress test Cardiolite study was 2001,  was read out as negative study with no evidence of ischemia with an EF of  64%.   In summary, this is a very pleasant gentleman who does seem medically stable  at this time.  He will continue his present medications.  He will be  notified as to his laboratory results for PSA,  hemoglobin A1c when these are available.  Patient will return to see me on  an as needed or in six months.                                   Rosalyn Gess Norins, MD   MEN/MedQ  DD:  11/26/2005  DT:  11/26/2005  Job #:  [id number]

## 2010-11-02 ENCOUNTER — Ambulatory Visit (INDEPENDENT_AMBULATORY_CARE_PROVIDER_SITE_OTHER): Payer: Medicare Other | Admitting: *Deleted

## 2010-11-02 DIAGNOSIS — I4891 Unspecified atrial fibrillation: Secondary | ICD-10-CM

## 2010-11-05 ENCOUNTER — Ambulatory Visit (INDEPENDENT_AMBULATORY_CARE_PROVIDER_SITE_OTHER): Payer: Medicare Other | Admitting: Internal Medicine

## 2010-11-05 ENCOUNTER — Telehealth: Payer: Self-pay | Admitting: *Deleted

## 2010-11-05 VITALS — HR 68 | Temp 97.4°F | Wt 188.0 lb

## 2010-11-05 DIAGNOSIS — W540XXA Bitten by dog, initial encounter: Secondary | ICD-10-CM

## 2010-11-05 DIAGNOSIS — T148XXA Other injury of unspecified body region, initial encounter: Secondary | ICD-10-CM

## 2010-11-05 NOTE — Telephone Encounter (Signed)
Add on today for quick eval

## 2010-11-05 NOTE — Telephone Encounter (Signed)
Seen in office today  

## 2010-11-05 NOTE — Telephone Encounter (Signed)
Daughter called. Pt received bite from dog last night who daughter believes is not up to date with shots. Pt is on coumadin and bleeding is under control. Does he need eval of wound today? (no open apts) Or ok to wait until tomorrow?

## 2010-11-06 ENCOUNTER — Ambulatory Visit: Payer: Medicare Other | Admitting: Internal Medicine

## 2010-11-07 NOTE — Progress Notes (Signed)
  Subjective:    Patient ID: Juan Carroll, male    DOB: 1928-03-22, 77 y.o.   MRN: [id number]  HPI Juan Carroll is well known to me. He present acutely for evaluation of dog bite. He was extending his hand towards a friend dog and reports he snagged the dogs teeth sustaining a minor laceration. He reports that the dog did not bite him.  I have reviewed the patient's medical history in detail and updated the computerized patient record.    Review of Systems  Constitutional: Negative for fever.  HENT: Negative.   Respiratory: Negative.   Cardiovascular: Negative.   Hematological: Negative.        Objective:   Physical Exam Vitals reviewed Gen'l - WNWD white man in no distress Derm - over the nuckles on the left hand is a small laceration c/w skin tear from dogs tooth. No puncture wound is noted. The wound appears clean with no swelling, exudate or sings of infection.       Assessment & Plan:  Dog bite - more of a laceration that is very minor with no puncture wound  Plan - bid cleansing with soap and water. Watch for signs of infection.

## 2010-11-17 ENCOUNTER — Other Ambulatory Visit: Payer: Self-pay | Admitting: Internal Medicine

## 2010-11-19 ENCOUNTER — Other Ambulatory Visit: Payer: Self-pay | Admitting: *Deleted

## 2010-11-19 MED ORDER — WARFARIN SODIUM 5 MG PO TABS: ORAL_TABLET | ORAL | Status: DC

## 2010-11-19 NOTE — Telephone Encounter (Signed)
Rxs [2] Done.

## 2010-11-29 ENCOUNTER — Encounter: Payer: Self-pay | Admitting: Internal Medicine

## 2010-11-29 ENCOUNTER — Other Ambulatory Visit: Payer: Self-pay | Admitting: Internal Medicine

## 2010-11-30 ENCOUNTER — Ambulatory Visit (INDEPENDENT_AMBULATORY_CARE_PROVIDER_SITE_OTHER): Payer: Medicare Other | Admitting: *Deleted

## 2010-11-30 DIAGNOSIS — I4891 Unspecified atrial fibrillation: Secondary | ICD-10-CM

## 2010-11-30 LAB — POCT INR: INR: 2.8

## 2010-12-03 ENCOUNTER — Ambulatory Visit (INDEPENDENT_AMBULATORY_CARE_PROVIDER_SITE_OTHER): Payer: Medicare Other | Admitting: Internal Medicine

## 2010-12-03 ENCOUNTER — Encounter: Payer: Self-pay | Admitting: Internal Medicine

## 2010-12-03 ENCOUNTER — Other Ambulatory Visit (INDEPENDENT_AMBULATORY_CARE_PROVIDER_SITE_OTHER): Payer: Medicare Other

## 2010-12-03 DIAGNOSIS — I2510 Atherosclerotic heart disease of native coronary artery without angina pectoris: Secondary | ICD-10-CM

## 2010-12-03 DIAGNOSIS — I10 Essential (primary) hypertension: Secondary | ICD-10-CM

## 2010-12-03 DIAGNOSIS — L578 Other skin changes due to chronic exposure to nonionizing radiation: Secondary | ICD-10-CM

## 2010-12-03 DIAGNOSIS — R413 Other amnesia: Secondary | ICD-10-CM

## 2010-12-03 DIAGNOSIS — E785 Hyperlipidemia, unspecified: Secondary | ICD-10-CM

## 2010-12-03 DIAGNOSIS — E119 Type 2 diabetes mellitus without complications: Secondary | ICD-10-CM

## 2010-12-03 DIAGNOSIS — Z0000 Encounter for general adult medical examination without abnormal findings: Secondary | ICD-10-CM

## 2010-12-03 DIAGNOSIS — I4891 Unspecified atrial fibrillation: Secondary | ICD-10-CM

## 2010-12-03 LAB — CBC WITH DIFFERENTIAL/PLATELET
Eosinophils Relative: 1.4 % (ref 0.0–5.0)
HCT: 47.3 % (ref 39.0–52.0)
Lymphocytes Relative: 18.2 % (ref 12.0–46.0)
Monocytes Relative: 9.9 % (ref 3.0–12.0)
Neutrophils Relative %: 70.3 % (ref 43.0–77.0)
Platelets: 180 10*3/uL (ref 150.0–400.0)
WBC: 8.9 10*3/uL (ref 4.5–10.5)

## 2010-12-03 LAB — COMPREHENSIVE METABOLIC PANEL
AST: 33 U/L (ref 0–37)
Alkaline Phosphatase: 94 U/L (ref 39–117)
BUN: 26 mg/dL — ABNORMAL HIGH (ref 6–23)
Creatinine, Ser: 1.3 mg/dL (ref 0.4–1.5)
Glucose, Bld: 158 mg/dL — ABNORMAL HIGH (ref 70–99)

## 2010-12-03 LAB — TSH: TSH: 1.44 u[IU]/mL (ref 0.35–5.50)

## 2010-12-03 LAB — LIPID PANEL
Cholesterol: 165 mg/dL (ref 0–200)
HDL: 55.2 mg/dL (ref >39.00)
LDL Cholesterol: 88 mg/dL (ref 0–99)
Triglycerides: 107 mg/dL (ref 0.0–149.0)
VLDL: 21.4 mg/dL (ref 0.0–40.0)

## 2010-12-03 LAB — HEPATIC FUNCTION PANEL
ALT: 24 U/L (ref 0–53)
AST: 33 U/L (ref 0–37)
Bilirubin, Direct: 0.3 mg/dL (ref 0.0–0.3)
Total Bilirubin: 1 mg/dL (ref 0.3–1.2)

## 2010-12-03 NOTE — Progress Notes (Signed)
Subjective:    Patient ID: Juan Carroll, male    DOB: December 18, 1927, 77 y.o.   MRN: [id number]  HPI The patient is here for annual Medicare wellness examination and management of other chronic and acute problems.   The risk factors are reflected in the social history.  The roster of all physicians providing medical care to patient - is listed in the Snapshot section of the chart.  Activities of daily living:  The patient is 100% inedpendent in all ADLs: dressing, toileting, feeding as well as independent mobility  Home safety : The patient has smoke detectors in the home. They wear seatbelts.  firearms are present in the home, kept in a safe fashion. There is no violence in the home.   There is no risks for hepatitis, STDs or HIV. There is no   history of blood transfusion. They have no travel history to infectious disease endemic areas of the world.  The patient has not seen their dentist in the last six month. They have  seen their eye doctor in the last year. They  admit to any hearing difficulty and have not had audiologic testing in the last year.  They do not  have excessive sun exposure. Discussed the need for sun protection: hats, long sleeves and use of sunscreen if there is significant sun exposure.   Diet: the importance of a healthy diet is discussed. They do have a healthy diet.  The patient has a regular exercise program: golf , 6 per week.  The benefits of regular aerobic exercise were discussed.  Depression screen: there are no signs or vegative symptoms of depression- irritability, change in appetite, anhedonia, sadness/tearfullness.  Cognitive assessment: the patient's daughter manages all his financial. He manages his personal affairs and is actively engaged; he keeps his tee times, keeps his golf score, drives home to golf course and to do errands.   The following portions of the patient's history were reviewed and updated as appropriate: allergies, current medications,  past family history, past medical history,  past surgical history, past social history  and problem list.  Vision, hearing, body mass index were assessed and reviewed.   During the course of the visit the patient was educated and counseled about appropriate screening and preventive services including : fall prevention , diabetes screening, nutrition counseling, colorectal cancer screening, and recommended immunizations.  Past Medical History  Diagnosis Date  . Degeneration of intervertebral disc, site unspecified   . Occlusion and stenosis of carotid artery without mention of cerebral infarction   . Unspecified essential hypertension   . Inguinal hernia without mention of obstruction or gangrene, unilateral or unspecified, (not specified as recurrent)   . Memory loss   . Peripheral vascular disease, unspecified   . Type II or unspecified type diabetes mellitus without mention of complication, not stated as uncontrolled   . Coronary atherosclerosis of unspecified type of vessel, native or graft   . Elevated prostate specific antigen (PSA)   . Atrial fibrillation   . Other and unspecified hyperlipidemia   . Chronic atrial fibrillation     on rate control, medicines and coumadin  . Hypertension   . Hyperlipemia   . Carotid stenosis     status post left carotid endarterectomy in 2008   Past Surgical History  Procedure Date  . Flexible sigmoidoscopy 04-28-98  . Carotid duplex 11-15-06  . Electrocardiogram 11-02-06  . Carotid endartectomy     left  . Hemorrhoid surgery   . Inguinal hernia  repair     right   Family History  Problem Relation Age of Onset  . Stroke Mother   . Parkinsonism Brother    History   Social History  . Marital Status: Widowed    Spouse Name: N/A    Number of Children: 2  . 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 Smoking status: Former  Games developer  . Smokeless tobacco: Never Used  . Alcohol Use: Not on file  . Drug Use: Not on file  . Sexually Active: Not on file   Other Topics Concern  . Not on file   Social History Narrative   Widowed; married in 1960Enjoys playing lots of golf, traveling w/ his girlfriendHas a grown daughter (1962) and son (1966), several grandchildrenPatient was a grounds Herbalist for [employer] Course        Review of Systems Review of Systems  Constitutional:  Negative for fever, chills, activity change and unexpected weight change.  HEENT:  Negative for hearing loss, ear pain, congestion, neck stiffness and postnasal drip. Negative for sore throat or swallowing problems. Negative for dental complaints.   Eyes: Negative for vision loss or change in visual acuity.  Respiratory: Negative for chest tightness and wheezing.   Cardiovascular: Negative for chest pain and palpitation. No decreased exercise tolerance Gastrointestinal: No change in bowel habit. No bloating or gas. No reflux or indigestion Genitourinary: Negative for urgency, frequency, flank pain and difficulty urinating.  Musculoskeletal: Negative for myalgias, back pain, arthralgias and gait problem.  Neurological: Negative for dizziness, tremors, weakness and headaches.  Hematological: Negative for adenopathy.  Psychiatric/Behavioral: Negative for behavioral problems and dysphoric mood.        Objective:   Physical Exam Vital signs reviewed Gen'l: Well nourished well developed     male in no acute distress  HENT:  Head: Normocephalic and atraumatic.  Right Ear: External ear normal. EAC/TM nl Left Ear: External ear normal.  EAC/TM nl Nose: Nose normal.  Mouth/Throat: Oropharynx is clear and moist. Dentition - upper denture and partial lower. No buccal or palatal lesions. Posterior pharynx clear. Eyes: Conjunctivae and sclera clear. EOM intact. Pupils are equal, round, and reactive to light. Right eye exhibits  no discharge. Left eye exhibits no discharge. Neck: Normal range of motion. Neck supple. No JVD present. No tracheal deviation present. No thyromegaly present.  Cardiovascular: Normal rate, regular rhythm, no gallop, no friction rub, no murmur heard.      Quiet precordium. 2+ radial and DP pulses . No carotid bruits Pulmonary/Chest: Effort normal. No respiratory distress or increased WOB, no wheezes, no rales. No chest wall deformity or CVAT. Abdominal: Soft. Bowel sounds are normal in all quadrants. He exhibits no distension, no tenderness, no rebound or guarding, No heptosplenomegaly  Genitourinary:  deferred Musculoskeletal: Normal range of motion. He exhibits no edema and no tenderness.       Small and large joints without redness, synovial thickening or deformity. Full range of motion preserved about all small, median and large joints.  Lymphadenopathy:    He has no cervical or supraclavicular adenopathy.  Neurological: He is alert and oriented to person, place, and time. CN II-XII intact. DTRs 2+ and symmetrical biceps, radial and patellar tendons. Cerebellar function normal with no tremor, rigidity, normal gait and station.  Skin: Skin is warm and dry. No rash noted. No erythema. Significant  solar damage arms, face, neck. Psychiatric: He has a normal mood and affect. His behavior is normal. Thought content normal.   MMSE: 1. Day,date,year - ok/no/no 2. Content: president- ok  Gov. - no  Current events - no recall 3. Number repitition: 5 fwd -    5 rev -  2 errors World reversed - ok 4. 3 word recall -  5. Serial 7's - 2 errors but recalibrated   , nickles in $1.25- ok    Change making - ok 6. Naming objects - ok      4 legged creatures-ok 7. Parables:  Glass House - concrete   Rolling stone - concrete 8. Judgement:  Letter          Fire 9. Clock face exercise -poor execution and could not place hands to read assigned time.   Lab Results  Component Value Date   WBC 8.9 12/03/2010    HGB 16.1 12/03/2010   HCT 47.3 12/03/2010   PLT 180.0 12/03/2010   CHOL 165 12/03/2010   TRIG 107.0 12/03/2010   HDL 55.20 12/03/2010   ALT 24 12/03/2010   ALT 24 12/03/2010   AST 33 12/03/2010   AST 33 12/03/2010   NA 138 12/03/2010   K 5.2* 12/03/2010   CL 100 12/03/2010   CREATININE 1.3 12/03/2010   BUN 26* 12/03/2010   CO2 25 12/03/2010   TSH 1.44 12/03/2010   INR 2.8 11/30/2010   HGBA1C 7.9* 12/03/2010   Lab Results  Component Value Date   LDLCALC 88 12/03/2010            Assessment & Plan:

## 2010-12-04 ENCOUNTER — Encounter: Payer: Self-pay | Admitting: Internal Medicine

## 2010-12-04 DIAGNOSIS — Z0000 Encounter for general adult medical examination without abnormal findings: Secondary | ICD-10-CM | POA: Insufficient documentation

## 2010-12-04 NOTE — Assessment & Plan Note (Signed)
For lipid panel today.  Lipid panel reveals good control with LDL in the 80's  Plan - continue present medications.

## 2010-12-04 NOTE — Assessment & Plan Note (Signed)
BP Readings from Last 3 Encounters:  12/03/10 140/74  12/16/09 122/74  10/16/09 120/68   Adequate control on his present medications. Will continue the same.

## 2010-12-04 NOTE — Assessment & Plan Note (Addendum)
Interval history is unremarkable. HIs physical exam is normal - deferred rectal exam. Lab results except for increased A1c are in normal range. He is current with colorectal cancer screening with last exam June '04. Immunizations: Pneumonia vaccine in '99; tetanus today. He is a candidate for shingles vaccine and will check his coverage.   In summary - a delightful man who appears to be medically stable and happy. He will return in 1 year or prn. He will continue with the coag clinic.

## 2010-12-04 NOTE — Assessment & Plan Note (Signed)
Full MMSE administered. His level of performance is really unchanged from previous year. Family reports that they have seen no progression or change.  Plan - continue Aricept.

## 2010-12-04 NOTE — Assessment & Plan Note (Signed)
Stable with no chest pain, discomfort or decreased exercise tolerance.  Plan - continue risk reduction management

## 2010-12-04 NOTE — Assessment & Plan Note (Signed)
Follows a reasonable diet but has indiscretions. A1C is 7.9% - above goal but at his age and level of activity wish to avoid hypoglycemia thus no change in his medications.  Plan - continue present meds.           Better diettary management.

## 2010-12-04 NOTE — Assessment & Plan Note (Signed)
Good rate control. No report of symptoms. Taking coumadin and reports for monitoring on schedule.  Plan - continue present medical regimen

## 2010-12-04 NOTE — Assessment & Plan Note (Signed)
Counseled about good sun protection. He does [place] a spray on sun blocker agent and wears a hat when playing golf. However, he still appears very tanned!

## 2010-12-28 ENCOUNTER — Encounter: Payer: Medicare Other | Admitting: *Deleted

## 2010-12-29 ENCOUNTER — Encounter: Payer: Self-pay | Admitting: Cardiovascular Disease

## 2010-12-29 ENCOUNTER — Ambulatory Visit (INDEPENDENT_AMBULATORY_CARE_PROVIDER_SITE_OTHER): Payer: Medicare Other | Admitting: *Deleted

## 2010-12-29 ENCOUNTER — Ambulatory Visit (INDEPENDENT_AMBULATORY_CARE_PROVIDER_SITE_OTHER): Payer: Medicare Other | Admitting: Cardiovascular Disease

## 2010-12-29 VITALS — BP 140/85 | HR 72 | Ht 73.0 in | Wt 187.8 lb

## 2010-12-29 DIAGNOSIS — I779 Disorder of arteries and arterioles, unspecified: Secondary | ICD-10-CM

## 2010-12-29 DIAGNOSIS — I4891 Unspecified atrial fibrillation: Secondary | ICD-10-CM

## 2010-12-29 DIAGNOSIS — I10 Essential (primary) hypertension: Secondary | ICD-10-CM

## 2010-12-29 DIAGNOSIS — I2510 Atherosclerotic heart disease of native coronary artery without angina pectoris: Secondary | ICD-10-CM

## 2010-12-29 LAB — POCT INR: INR: 2.6

## 2010-12-29 NOTE — Assessment & Plan Note (Signed)
Well controlled. No changes. 

## 2010-12-29 NOTE — Patient Instructions (Signed)
Your physician recommends that you schedule a follow-up appointment in: 1 year  Your physician has requested that you have a carotid duplex. This test is an ultrasound of the carotid arteries in your neck. It looks at blood flow through these arteries that supply the brain with blood. Allow one hour for this exam. There are no restrictions or special instructions. In 1-2 months

## 2010-12-29 NOTE — Assessment & Plan Note (Signed)
Rate controlled on coumadin 

## 2010-12-29 NOTE — Assessment & Plan Note (Signed)
He has had previous Left CEA. Will need surveillance dopplers. We will call to arrange.

## 2010-12-29 NOTE — Progress Notes (Signed)
History of Present Illness:77 yo WM with h/o of non-obstructive CAD, atrial fibrillation, carotid artery disease, mild dementia and HTN. He has been followed in the past by Dr. Juanda Chance for his cardiac issues.  He has chronic atrial fibrillation which is managed with rate control and Coumadin. He has a [place] score of  2 and a [place] Vasc score 4.  He has documented nonobstructive coronary disease. He has had previous carotid endarterectomy. He is an avid golfer and can shoot his age. He plays five days per week.   He's had no recent symptoms of chest pain, shortness of breath or palpitations.   Past Medical History  Diagnosis Date  . Degeneration of intervertebral disc, site unspecified   . Occlusion and stenosis of carotid artery without mention of cerebral infarction   . Unspecified essential hypertension   . Inguinal hernia without mention of obstruction or gangrene, unilateral or unspecified, (not specified as recurrent)   . Memory loss   . Peripheral vascular disease, unspecified   . Type II or unspecified type diabetes mellitus without mention of complication, not stated as uncontrolled   . Coronary atherosclerosis of unspecified type of vessel, native or graft   . Elevated prostate specific antigen (PSA)   . Atrial fibrillation   . Other and unspecified hyperlipidemia   . Chronic atrial fibrillation     on rate control, medicines and coumadin  . Hypertension   . Hyperlipemia   . Carotid stenosis     status post left carotid endarterectomy in 2008    Past Surgical History  Procedure Date  . Flexible sigmoidoscopy 04-28-98  . Carotid duplex 11-15-06  . Electrocardiogram 11-02-06  . Carotid endartectomy     left  . Hemorrhoid surgery   . Inguinal hernia repair     right    Current Outpatient Prescriptions  Medication Sig Dispense Refill  . aspirin 81 MG tablet Take 81 mg by mouth daily.        . digoxin (LANOXIN) 0.25 MG tablet Take 1 tablet (250 mcg total) by mouth daily.  30  tablet  0  . donepezil (ARICEPT) 10 MG tablet TAKE 1 TABLET DAILY  90 tablet  2  . glimepiride (AMARYL) 1 MG tablet Take 1/2 tablet by mouth once daily      . ramipril (ALTACE) 10 MG capsule TAKE 1 CAPSULE DAILY  90 capsule  1  . simvastatin (ZOCOR) 20 MG tablet TAKE 1 TABLET DAILY  90 tablet  1  . warfarin (COUMADIN) 5 MG tablet Take as directed by Anticoagulation clinic  90 tablet  3    No Known Allergies  History   Social History  . Marital Status: Widowed    Spouse Name: N/A    Number of Children: 2  . 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 Smoking status: Former Games developer  . Smokeless tobacco: Never Used  . Alcohol Use: No  . Drug Use: No  . Sexually Active: Yes -- Male partner(s)   Other Topics Concern  . Not on file   Social History Narrative   Widowed; married in 1960. Enjoys playing lots of golf. Has a grown daughter (1962) who is his primary care-taker and son (1966), several grandchildren. 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 Discussed end of life  care: he does not favor CPR or mechanical ventilator support.    Family History  Problem Relation Age of Onset  . Stroke Mother   . Parkinsonism Brother     Review of Systems:  As stated in the HPI and otherwise negative.   BP 140/85  Pulse 72  Ht 6\' 1"  (1.854 m)  Wt 187 lb 12.8 oz (85.186 kg)  BMI 24.78 kg/m2  Physical Examination: General: Well developed, well nourished, NAD HEENT: OP clear, mucus membranes moist SKIN: warm, dry. No rashes. Neuro: No focal deficits Musculoskeletal: Muscle strength 5/5 all ext Psychiatric: Mood and affect normal Neck: No JVD, no carotid bruits, no thyromegaly, no lymphadenopathy. Lungs:Clear bilaterally, no wheezes, rhonci, crackles Cardiovascular: Irregular. No murmurs, gallops  or rubs. Abdomen:Soft. Bowel sounds present. Non-tender.  Extremities: No lower extremity edema. Pulses are 2 + in the bilateral DP/PT.  WUJ:WJXBJY fibrillation, rate 72 bpm.

## 2010-12-29 NOTE — Assessment & Plan Note (Signed)
Stable

## 2011-01-26 ENCOUNTER — Encounter: Payer: Medicare Other | Admitting: *Deleted

## 2011-01-29 ENCOUNTER — Ambulatory Visit (INDEPENDENT_AMBULATORY_CARE_PROVIDER_SITE_OTHER): Payer: Medicare Other | Admitting: *Deleted

## 2011-01-29 ENCOUNTER — Encounter (INDEPENDENT_AMBULATORY_CARE_PROVIDER_SITE_OTHER): Payer: Medicare Other | Admitting: *Deleted

## 2011-01-29 DIAGNOSIS — I4891 Unspecified atrial fibrillation: Secondary | ICD-10-CM

## 2011-01-29 DIAGNOSIS — I6529 Occlusion and stenosis of unspecified carotid artery: Secondary | ICD-10-CM

## 2011-02-24 LAB — URINALYSIS, ROUTINE W REFLEX MICROSCOPIC
Bilirubin Urine: NEGATIVE
Hgb urine dipstick: NEGATIVE
Nitrite: NEGATIVE
Specific Gravity, Urine: 1.022
pH: 5.5

## 2011-02-24 LAB — COMPREHENSIVE METABOLIC PANEL
AST: 33
Albumin: 3.8
Alkaline Phosphatase: 81
Chloride: 103
GFR calc Af Amer: >60
Potassium: 5.5 — ABNORMAL HIGH
Total Bilirubin: 0.8
Total Protein: 6.1

## 2011-02-24 LAB — PROTIME-INR
INR: 1.1
Prothrombin Time: 13.7

## 2011-02-24 LAB — TYPE AND SCREEN
ABO/RH(D): O POS
Antibody Screen: NEGATIVE

## 2011-02-24 LAB — CBC
HCT: 36.6 — ABNORMAL LOW
Hemoglobin: 12.7 — ABNORMAL LOW
Hemoglobin: 14.8
MCHC: 34.1
MCHC: 34.6
MCV: 94.2
MCV: 95.8
RBC: 3.89 — ABNORMAL LOW
RBC: 4.52
RDW: 13.2
WBC: 6.5

## 2011-02-24 LAB — BASIC METABOLIC PANEL
CO2: 27
Calcium: 8.5
Chloride: 102
GFR calc Af Amer: >60
Glucose, Bld: 100 — ABNORMAL HIGH
Sodium: 137

## 2011-02-24 LAB — APTT: aPTT: 39 — ABNORMAL HIGH

## 2011-02-26 ENCOUNTER — Ambulatory Visit (INDEPENDENT_AMBULATORY_CARE_PROVIDER_SITE_OTHER): Payer: Medicare Other | Admitting: *Deleted

## 2011-02-26 DIAGNOSIS — I4891 Unspecified atrial fibrillation: Secondary | ICD-10-CM

## 2011-03-19 ENCOUNTER — Ambulatory Visit (INDEPENDENT_AMBULATORY_CARE_PROVIDER_SITE_OTHER): Payer: Medicare Other | Admitting: *Deleted

## 2011-03-19 DIAGNOSIS — I4891 Unspecified atrial fibrillation: Secondary | ICD-10-CM

## 2011-04-01 IMAGING — CR DG CHEST 2V
2 series · 2 of 2 positions shown · non-contrast
Comparison: 03/01/2007

CLINICAL DATA: Preop respiratory exam.  Inguinal hernia. Previous
smoker

CHEST - 2 VIEW

[w chest pa]
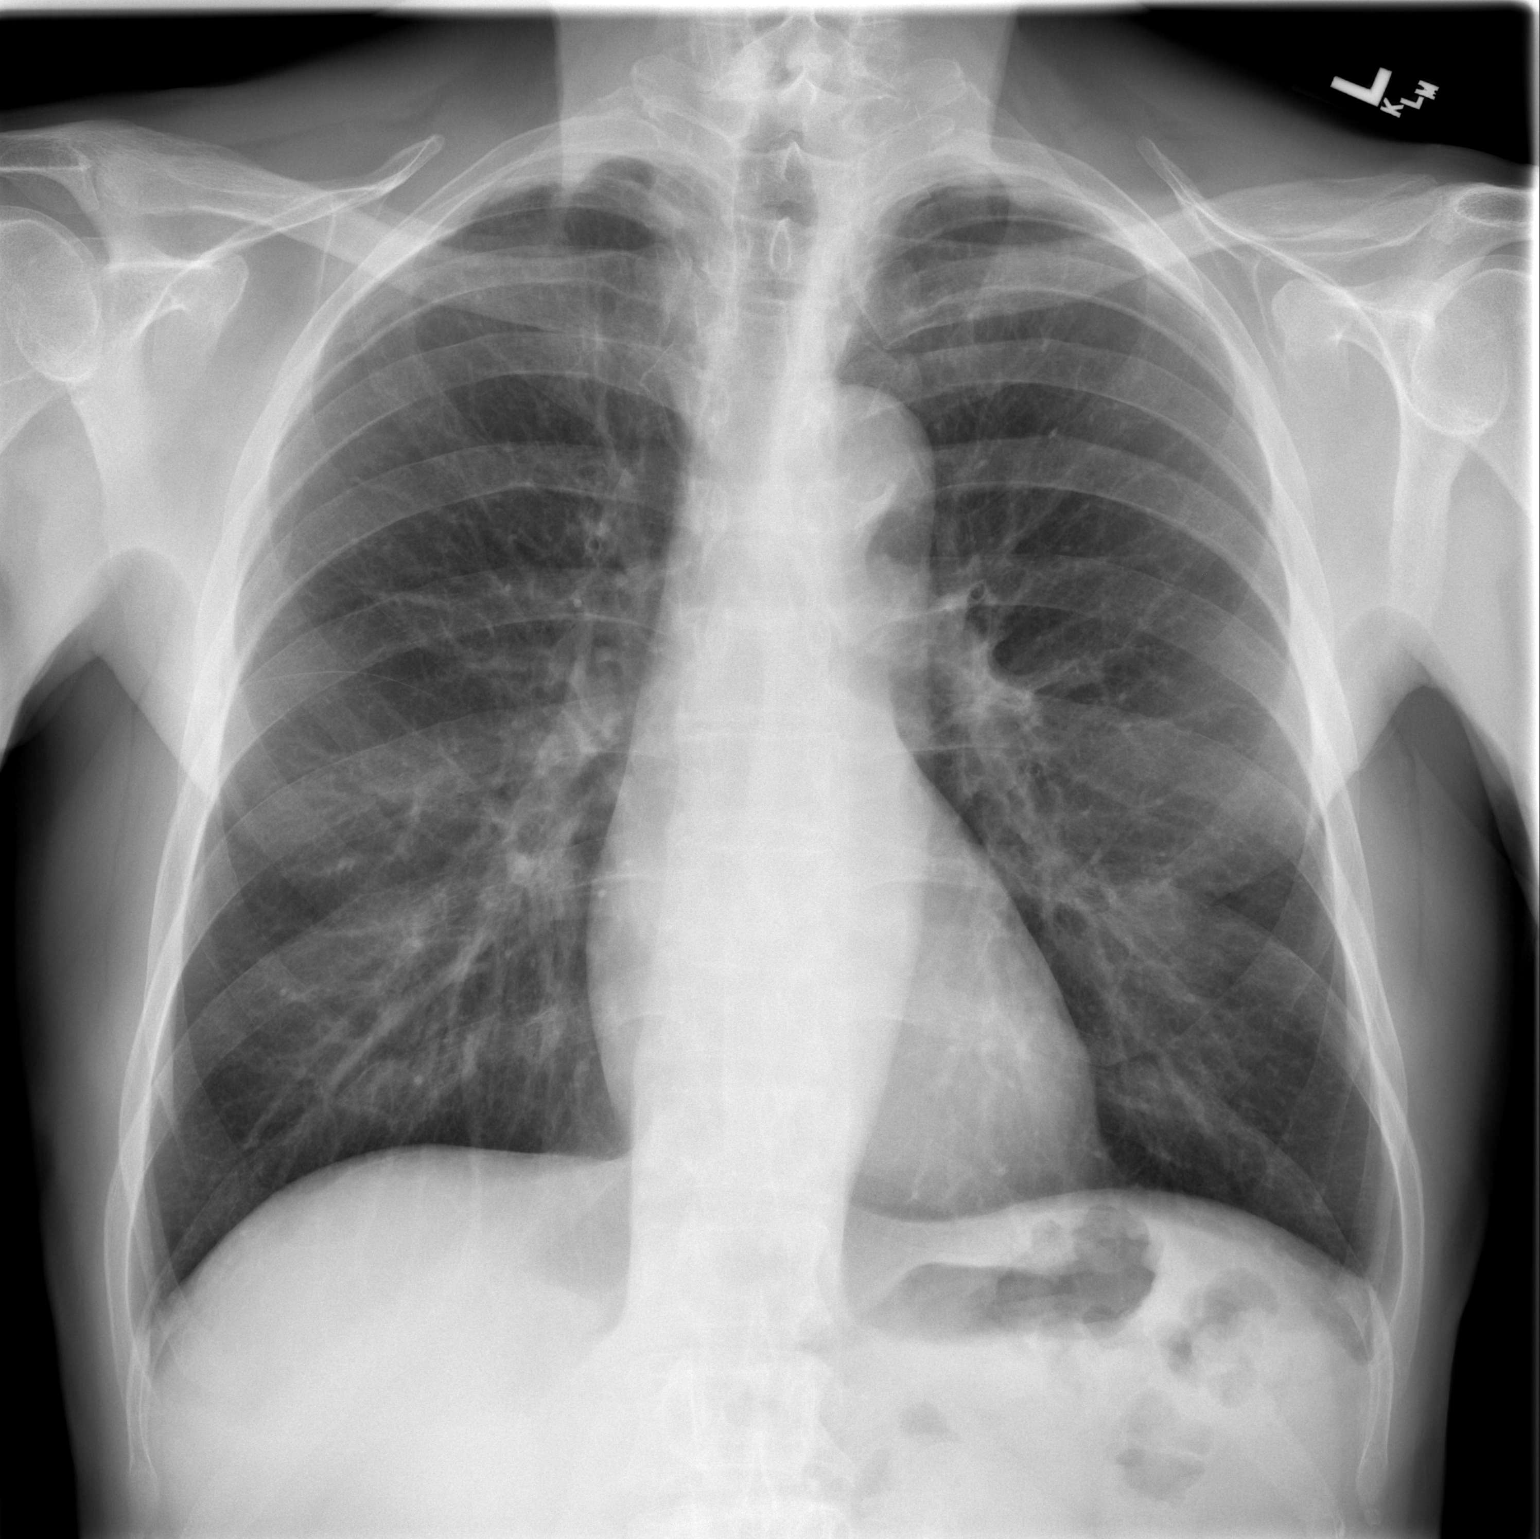

[w chest lat]
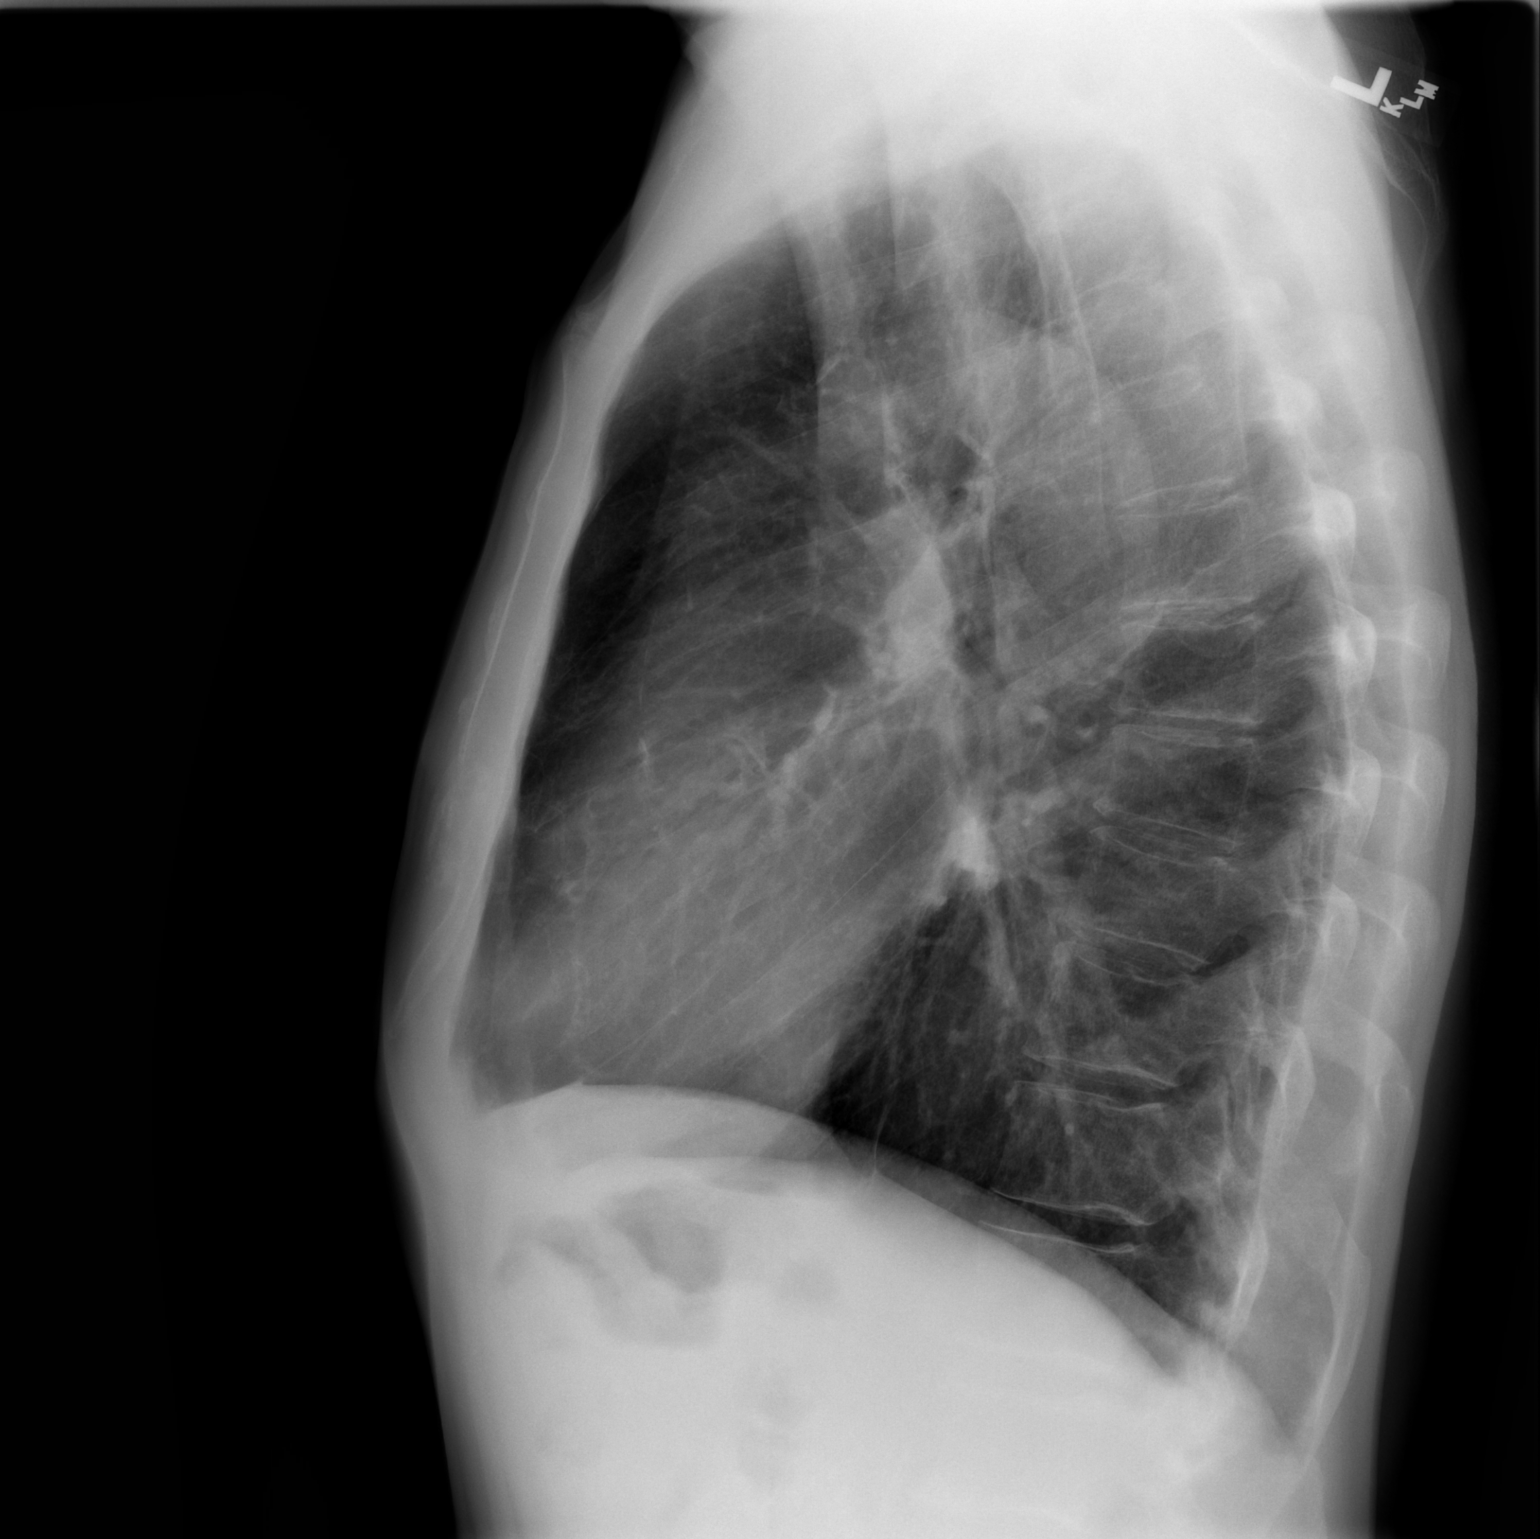

[2 of 2 positions shown; findings below may reference images not displayed]

FINDINGS: Lungs well expanded and clear.  Normal cardiomediastinal
silhouette.  Bony thorax intact.
IMPRESSION: No active chest disease or interval change.

## 2011-04-16 ENCOUNTER — Ambulatory Visit (INDEPENDENT_AMBULATORY_CARE_PROVIDER_SITE_OTHER): Payer: Medicare Other | Admitting: *Deleted

## 2011-04-16 DIAGNOSIS — I4891 Unspecified atrial fibrillation: Secondary | ICD-10-CM

## 2011-04-16 LAB — POCT INR: INR: 2.7

## 2011-04-30 ENCOUNTER — Other Ambulatory Visit: Payer: Self-pay | Admitting: Internal Medicine

## 2011-04-30 DIAGNOSIS — Z0279 Encounter for issue of other medical certificate: Secondary | ICD-10-CM

## 2011-04-30 DIAGNOSIS — L578 Other skin changes due to chronic exposure to nonionizing radiation: Secondary | ICD-10-CM

## 2011-05-14 ENCOUNTER — Encounter: Payer: Medicare Other | Admitting: *Deleted

## 2011-05-17 ENCOUNTER — Other Ambulatory Visit: Payer: Self-pay | Admitting: *Deleted

## 2011-05-17 ENCOUNTER — Telehealth: Payer: Self-pay | Admitting: *Deleted

## 2011-05-17 MED ORDER — RAMIPRIL 10 MG PO CAPS: 10.0000 mg | ORAL_CAPSULE | Freq: Every day | ORAL | Status: DC

## 2011-05-17 MED ORDER — SIMVASTATIN 20 MG PO TABS: 20.0000 mg | ORAL_TABLET | Freq: Every day | ORAL | Status: DC

## 2011-05-17 MED ORDER — DIGOXIN 250 MCG PO TABS: 250.0000 ug | ORAL_TABLET | Freq: Every day | ORAL | Status: DC

## 2011-05-17 NOTE — Telephone Encounter (Signed)
Digoxin & Ramipril request. Done.

## 2011-05-21 ENCOUNTER — Other Ambulatory Visit: Payer: Self-pay

## 2011-05-21 MED ORDER — DONEPEZIL HCL 10 MG PO TABS: 10.0000 mg | ORAL_TABLET | Freq: Every evening | ORAL | Status: DC | PRN

## 2011-05-25 ENCOUNTER — Ambulatory Visit (INDEPENDENT_AMBULATORY_CARE_PROVIDER_SITE_OTHER): Payer: Medicare Other | Admitting: *Deleted

## 2011-05-25 DIAGNOSIS — I4891 Unspecified atrial fibrillation: Secondary | ICD-10-CM

## 2011-05-25 LAB — POCT INR: INR: 2.3

## 2011-07-06 ENCOUNTER — Ambulatory Visit (INDEPENDENT_AMBULATORY_CARE_PROVIDER_SITE_OTHER): Payer: Medicare Other | Admitting: Pharmacist

## 2011-07-06 DIAGNOSIS — I4891 Unspecified atrial fibrillation: Secondary | ICD-10-CM

## 2011-07-06 LAB — POCT INR: INR: 2.2

## 2011-07-13 ENCOUNTER — Other Ambulatory Visit: Payer: Self-pay | Admitting: *Deleted

## 2011-07-13 MED ORDER — DONEPEZIL HCL 10 MG PO TABS: 10.0000 mg | ORAL_TABLET | Freq: Every evening | ORAL | Status: DC | PRN

## 2011-07-14 ENCOUNTER — Other Ambulatory Visit: Payer: Self-pay | Admitting: *Deleted

## 2011-07-14 MED ORDER — DIGOXIN 250 MCG PO TABS: 250.0000 ug | ORAL_TABLET | Freq: Every day | ORAL | Status: DC

## 2011-07-14 MED ORDER — WARFARIN SODIUM 5 MG PO TABS: ORAL_TABLET | ORAL | Status: DC

## 2011-07-14 MED ORDER — SIMVASTATIN 20 MG PO TABS: 20.0000 mg | ORAL_TABLET | Freq: Every day | ORAL | Status: DC

## 2011-07-14 MED ORDER — RAMIPRIL 10 MG PO CAPS: 10.0000 mg | ORAL_CAPSULE | Freq: Every day | ORAL | Status: DC

## 2011-07-14 MED ORDER — DONEPEZIL HCL 10 MG PO TABS: 10.0000 mg | ORAL_TABLET | Freq: Every evening | ORAL | Status: DC | PRN

## 2011-07-14 MED ORDER — GLIMEPIRIDE 1 MG PO TABS: ORAL_TABLET | ORAL | Status: DC

## 2011-07-14 NOTE — Telephone Encounter (Signed)
Request by mail given by MEN for 90-day supply Rx[s] to [facility] usage must be hard copy faxed]. Rx[s] printed & awaiting MD signature.

## 2011-08-23 ENCOUNTER — Ambulatory Visit (INDEPENDENT_AMBULATORY_CARE_PROVIDER_SITE_OTHER): Payer: Medicare Other | Admitting: *Deleted

## 2011-08-23 DIAGNOSIS — I4891 Unspecified atrial fibrillation: Secondary | ICD-10-CM

## 2011-08-23 LAB — POCT INR: INR: 3.1

## 2011-10-04 ENCOUNTER — Ambulatory Visit (INDEPENDENT_AMBULATORY_CARE_PROVIDER_SITE_OTHER): Payer: Medicare Other | Admitting: *Deleted

## 2011-10-04 DIAGNOSIS — I4891 Unspecified atrial fibrillation: Secondary | ICD-10-CM

## 2011-10-04 LAB — POCT INR: INR: 2.4

## 2011-11-15 ENCOUNTER — Ambulatory Visit (INDEPENDENT_AMBULATORY_CARE_PROVIDER_SITE_OTHER): Payer: Medicare Other | Admitting: *Deleted

## 2011-11-15 DIAGNOSIS — I4891 Unspecified atrial fibrillation: Secondary | ICD-10-CM

## 2011-11-15 LAB — POCT INR: INR: 2.9

## 2011-11-25 ENCOUNTER — Other Ambulatory Visit: Payer: Self-pay

## 2011-11-25 MED ORDER — DONEPEZIL HCL 10 MG PO TABS: 10.0000 mg | ORAL_TABLET | Freq: Every evening | ORAL | Status: DC | PRN

## 2011-11-25 NOTE — Telephone Encounter (Signed)
Pt's daughter advised of refill via VM

## 2011-12-21 ENCOUNTER — Encounter: Payer: Self-pay | Admitting: Endocrinology

## 2011-12-21 ENCOUNTER — Ambulatory Visit (INDEPENDENT_AMBULATORY_CARE_PROVIDER_SITE_OTHER): Payer: Medicare Other | Admitting: Endocrinology

## 2011-12-21 ENCOUNTER — Other Ambulatory Visit (INDEPENDENT_AMBULATORY_CARE_PROVIDER_SITE_OTHER): Payer: Medicare Other

## 2011-12-21 VITALS — BP 122/68 | HR 59 | Temp 97.2°F

## 2011-12-21 DIAGNOSIS — E785 Hyperlipidemia, unspecified: Secondary | ICD-10-CM

## 2011-12-21 DIAGNOSIS — R972 Elevated prostate specific antigen [PSA]: Secondary | ICD-10-CM

## 2011-12-21 DIAGNOSIS — E119 Type 2 diabetes mellitus without complications: Secondary | ICD-10-CM

## 2011-12-21 DIAGNOSIS — I10 Essential (primary) hypertension: Secondary | ICD-10-CM

## 2011-12-21 DIAGNOSIS — Z79899 Other long term (current) drug therapy: Secondary | ICD-10-CM

## 2011-12-21 DIAGNOSIS — Z0000 Encounter for general adult medical examination without abnormal findings: Secondary | ICD-10-CM

## 2011-12-21 DIAGNOSIS — R413 Other amnesia: Secondary | ICD-10-CM

## 2011-12-21 DIAGNOSIS — R350 Frequency of micturition: Secondary | ICD-10-CM

## 2011-12-21 LAB — BASIC METABOLIC PANEL
CO2: 28 mEq/L (ref 19–32)
Calcium: 10 mg/dL (ref 8.4–10.5)
Chloride: 91 mEq/L — ABNORMAL LOW (ref 96–112)
Potassium: 5.4 mEq/L — ABNORMAL HIGH (ref 3.5–5.1)
Sodium: 128 mEq/L — ABNORMAL LOW (ref 135–145)

## 2011-12-21 LAB — PSA: PSA: 3.96 ng/mL (ref 0.10–4.00)

## 2011-12-21 LAB — POCT URINALYSIS DIPSTICK
Blood, UA: POSITIVE
Nitrite, UA: NEGATIVE
Spec Grav, UA: 1.01
Urobilinogen, UA: 0.2
pH, UA: 6

## 2011-12-21 LAB — CBC WITH DIFFERENTIAL/PLATELET
Basophils Relative: 0.2 % (ref 0.0–3.0)
HCT: 46.2 % (ref 39.0–52.0)
Hemoglobin: 15.7 g/dL (ref 13.0–17.0)
Lymphocytes Relative: 26.9 % (ref 12.0–46.0)
Lymphs Abs: 2.8 10*3/uL (ref 0.7–4.0)
MCHC: 34 g/dL (ref 30.0–36.0)
Monocytes Relative: 7.9 % (ref 3.0–12.0)
Neutro Abs: 6.6 10*3/uL (ref 1.4–7.7)
RBC: 4.76 Mil/uL (ref 4.22–5.81)

## 2011-12-21 LAB — HEMOGLOBIN A1C: Hgb A1c MFr Bld: 14.3 % — ABNORMAL HIGH (ref 4.6–6.5)

## 2011-12-21 LAB — LIPID PANEL
HDL: 56.9 mg/dL (ref >39.00)
VLDL: 94 mg/dL — ABNORMAL HIGH (ref 0.0–40.0)

## 2011-12-21 LAB — HEPATIC FUNCTION PANEL
Albumin: 3.8 g/dL (ref 3.5–5.2)
Alkaline Phosphatase: 186 U/L — ABNORMAL HIGH (ref 39–117)
Total Bilirubin: 0.9 mg/dL (ref 0.3–1.2)

## 2011-12-21 NOTE — Progress Notes (Signed)
Subjective:    Patient ID: Juan Carroll, male    DOB: [birth year], 78 y.o.   MRN: [id number]  HPI Pt states few weeks of moderate urinary frequency, worse in the context of poor diet, but no assoc dysuria.  Pt does not check cbg's at home. Memory loss persists. Past Medical History  Diagnosis Date  . Degeneration of intervertebral disc, site unspecified   . Occlusion and stenosis of carotid artery without mention of cerebral infarction   . Unspecified essential hypertension   . Inguinal hernia without mention of obstruction or gangrene, unilateral or unspecified, (not specified as recurrent)   . Memory loss   . Peripheral vascular disease, unspecified   . Type II or unspecified type diabetes mellitus without mention of complication, not stated as uncontrolled   . Coronary atherosclerosis of unspecified type of vessel, native or graft   . Elevated prostate specific antigen (PSA)   . Atrial fibrillation   . Other and unspecified hyperlipidemia   . Chronic atrial fibrillation     on rate control, medicines and coumadin  . Hypertension   . Hyperlipemia   . Carotid stenosis     status post left carotid endarterectomy in 2008    Past Surgical History  Procedure Date  . Flexible sigmoidoscopy 04-28-98  . Carotid duplex 11-15-06  . Electrocardiogram 11-02-06  . Carotid endartectomy     left  . Hemorrhoid surgery   . Inguinal hernia repair     right    History   Social History  . Marital Status: Widowed    Spouse Name: N/A    Number of Children: 2  . 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 Smoking status: Former Games developer  . Smokeless tobacco: Never Used  . Alcohol Use: No  . Drug Use: No  . Sexually Active: Yes -- Male partner(s)   Other Topics Concern  . Not on file   Social History Narrative   Widowed; married in 1960. Enjoys playing lots of golf. Has a  grown daughter (1962) who is his primary care-taker and son (1966), several grandchildren. 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 Discussed end of life care: he does not favor CPR or mechanical ventilator support.    Current Outpatient Prescriptions on File Prior to Visit  Medication Sig Dispense Refill  . aspirin 81 MG tablet Take 81 mg by mouth daily.        . digoxin (LANOXIN) 0.25 MG tablet Take 1 tablet (250 mcg total) by mouth daily.  90 tablet  1  . donepezil (ARICEPT) 10 MG tablet Take 1 tablet (10 mg total) by mouth at bedtime as needed.  30 tablet  1  . glimepiride (AMARYL) 1 MG tablet Take 1/2 tablet by mouth once daily.  45 tablet  1  . ramipril (ALTACE) 10 MG capsule Take 1 capsule (10 mg total) by mouth daily.  90 capsule  1  . simvastatin (ZOCOR) 20 MG tablet Take 1 tablet (20 mg total) by mouth at bedtime.  90 tablet  1  . warfarin (COUMADIN) 5 MG tablet Take as directed by Anticoagulation clinic  90 tablet  1    No Known Allergies  Family History  Problem Relation Age of Onset  . Stroke Mother   . Parkinsonism Brother  BP 122/68  Pulse 59  Temp 97.2 F (36.2 C) (Oral)  SpO2 94%  Review of Systems denies weight change and n/v    Objective:   Physical Exam VITAL SIGNS:  See vs page GENERAL: no distress Gait: normal and steady.  Lab Results  Component Value Date   WBC 10.3 12/21/2011   HGB 15.7 12/21/2011   HCT 46.2 12/21/2011   PLT 197.0 12/21/2011   GLUCOSE 412* 12/21/2011   CHOL 202* 12/21/2011   TRIG 470.0* 12/21/2011   HDL 56.90 12/21/2011   LDLDIRECT 85.8 12/21/2011   LDLCALC 88 12/03/2010   ALT 42 12/21/2011   AST 41* 12/21/2011   NA 128* 12/21/2011   K 5.4* 12/21/2011   CL 91* 12/21/2011   CREATININE 1.2 12/21/2011   BUN 23 12/21/2011   CO2 28 12/21/2011   TSH 0.80 12/21/2011   PSA 3.96 12/21/2011   INR 2.9 11/15/2011   HGBA1C 14.3* 12/21/2011   MICROALBUR 10.8* 12/21/2011      Assessment &  Plan:  DM.  Much worse.  Not clinically ill from this, though. Urinary sxs, due to DM Hyperkalemia, mild Memory loss, same Dyslipidemia, exac by hyperglycemia

## 2011-12-21 NOTE — Patient Instructions (Addendum)
blood tests are being requested for you today.  You will receive a letter with results. Please see dr Debby Bud soon for your annual medicare checkup.  (update:  i called dr Debby Bud.  He'll call pt in)

## 2011-12-22 LAB — MICROALBUMIN / CREATININE URINE RATIO: Microalb Creat Ratio: 19.2 mg/g (ref 0.0–30.0)

## 2011-12-22 LAB — LDL CHOLESTEROL, DIRECT: Direct LDL: 85.8 mg/dL

## 2011-12-23 ENCOUNTER — Ambulatory Visit (INDEPENDENT_AMBULATORY_CARE_PROVIDER_SITE_OTHER): Payer: Medicare Other | Admitting: Internal Medicine

## 2011-12-23 ENCOUNTER — Encounter: Payer: Self-pay | Admitting: Internal Medicine

## 2011-12-23 VITALS — BP 112/70 | HR 68 | Temp 97.0°F | Resp 16 | Wt 192.0 lb

## 2011-12-23 DIAGNOSIS — E119 Type 2 diabetes mellitus without complications: Secondary | ICD-10-CM

## 2011-12-23 MED ORDER — METFORMIN HCL 500 MG PO TABS: 500.0000 mg | ORAL_TABLET | Freq: Two times a day (BID) | ORAL | Status: DC

## 2011-12-23 NOTE — Patient Instructions (Addendum)
Diabetes - OUT OF CONTROL!!!!! Plan NO SUGAR, low carbohydrates.  Stop the glimepiride (Amaryl)  Start Metformin 500 mg once a day for 10 days, then twice a day  Come by the office in 7-10 days for a finger stick blood sugar check before lunch   Diabetes Meal Planning Guide The diabetes meal planning guide is a tool to help you plan your meals and snacks. It is important for people with diabetes to manage their blood glucose (sugar) levels. Choosing the right foods and the right amounts throughout your day will help control your blood glucose. Eating right can even help you improve your blood pressure and reach or maintain a healthy weight. CARBOHYDRATE COUNTING MADE EASY When you eat carbohydrates, they turn to sugar. This raises your blood glucose level. Counting carbohydrates can help you control this level so you feel better. When you plan your meals by counting carbohydrates, you can have more flexibility in what you eat and balance your medicine with your food intake. Carbohydrate counting simply means adding up the total amount of carbohydrate grams in your meals and snacks. Try to eat about the same amount at each meal. Foods with carbohydrates are listed below. Each portion below is 1 carbohydrate serving or 15 grams of carbohydrates. Ask your dietician how many grams of carbohydrates you should eat at each meal or snack. Grains and Starches  1 slice bread.    English muffin or hotdog/hamburger bun.    cup cold cereal (unsweetened).   ? cup cooked pasta or rice.    cup starchy vegetables (corn, potatoes, peas, beans, winter squash).   1 tortilla (6 inches).    bagel.   1 waffle or pancake (size of a CD).    cup cooked cereal.   4 to 6 small crackers.  *Whole grain is recommended. Fruit  1 cup fresh unsweetened berries, melon, papaya, pineapple.   1 small fresh fruit.    banana or mango.    cup fruit juice (4 oz unsweetened).    cup canned fruit in natural  juice or water.   2 tbs dried fruit.   12 to 15 grapes or cherries.  Milk and Yogurt  1 cup fat-free or 1% milk.   1 cup soy milk.   6 oz light yogurt with sugar-free sweetener.   6 oz low-fat soy yogurt.   6 oz plain yogurt.  Vegetables  1 cup raw or  cup cooked is counted as 0 carbohydrates or a "free" food.   If you eat 3 or more servings at 1 meal, count them as 1 carbohydrate serving.  Other Carbohydrates   oz chips or pretzels.    cup ice cream or frozen yogurt.    cup sherbet or sorbet.   2 inch square cake, no frosting.   1 tbs honey, sugar, jam, jelly, or syrup.   2 small cookies.   3 squares of graham crackers.   3 cups popcorn.   6 crackers.   1 cup broth-based soup.   Count 1 cup casserole or other mixed foods as 2 carbohydrate servings.   Foods with less than 20 calories in a serving may be counted as 0 carbohydrates or a "free" food.  You may want to purchase a book or computer software that lists the carbohydrate gram counts of different foods. In addition, the nutrition facts panel on the labels of the foods you eat are a good source of this information. The label will tell you how big the  serving size is and the total number of carbohydrate grams you will be eating per serving. Divide this number by 15 to obtain the number of carbohydrate servings in a portion. Remember, 1 carbohydrate serving equals 15 grams of carbohydrate. SERVING SIZES Measuring foods and serving sizes helps you make sure you are getting the right amount of food. The list below tells how big or small some common serving sizes are.  1 oz.........4 stacked dice.   3 oz........Marland KitchenDeck of cards.   1 tsp.......Marland KitchenTip of little finger.   1 tbs......Marland KitchenMarland KitchenThumb.   2 tbs.......Marland KitchenGolf ball.    cup......Marland KitchenHalf of a fist.   1 cup.......Marland KitchenA fist.  SAMPLE DIABETES MEAL PLAN Below is a sample meal plan that includes foods from the grain and starches, dairy, vegetable, fruit, and meat  groups. A dietician can individualize a meal plan to fit your calorie needs and tell you the number of servings needed from each food group. However, controlling the total amount of carbohydrates in your meal or snack is more important than making sure you include all of the food groups at every meal. You may interchange carbohydrate containing foods (dairy, starches, and fruits). The meal plan below is an example of a 2000 calorie diet using carbohydrate counting. This meal plan has 17 carbohydrate servings. Breakfast  1 cup oatmeal (2 carb servings).    cup light yogurt (1 carb serving).   1 cup blueberries (1 carb serving).    cup almonds.  Snack  1 large apple (2 carb servings).   1 low-fat string cheese stick.  Lunch  Chicken breast salad.   1 cup spinach.    cup chopped tomatoes.   2 oz chicken breast, sliced.   2 tbs low-fat [place] dressing.   12 whole-wheat crackers (2 carb servings).   12 to 15 grapes (1 carb serving).   1 cup low-fat milk (1 carb serving).  Snack  1 cup carrots.    cup hummus (1 carb serving).  Dinner  3 oz broiled salmon.   1 cup brown rice (3 carb servings).  Snack  1  cups steamed broccoli (1 carb serving) drizzled with 1 tsp olive oil and lemon juice.   1 cup light pudding (2 carb servings).  DIABETES MEAL PLANNING WORKSHEET Your dietician can use this worksheet to help you decide how many servings of foods and what types of foods are right for you.   BREAKFAST Food Group and Servings / Carb Servings Grain/Starches __________________________________ Dairy __________________________________________ Vegetable ______________________________________ Fruit ___________________________________________ Meat __________________________________________ Fat ____________________________________________ LUNCH Food Group and Servings / Carb Servings Grain/Starches ___________________________________ Dairy  ___________________________________________ Fruit ____________________________________________ Meat ___________________________________________ Fat _____________________________________________ Laural Golden Food Group and Servings / Carb Servings Grain/Starches ___________________________________ Dairy ___________________________________________ Fruit ____________________________________________ Meat ___________________________________________ Fat _____________________________________________ SNACKS Food Group and Servings / Carb Servings Grain/Starches ___________________________________ Dairy ___________________________________________ Vegetable _______________________________________ Fruit ____________________________________________ Meat ___________________________________________ Fat _____________________________________________ DAILY TOTALS Starches _________________________ Vegetable ________________________ Fruit ____________________________ Dairy ____________________________ Meat ____________________________ Fat ______________________________ Document Released: 01/28/2005 Document Revised: 04/22/2011 Document Reviewed: 12/09/2008 Exit[employer] Patient Information 2012 [place], [place].

## 2011-12-24 NOTE — Assessment & Plan Note (Signed)
Previous A1C Summer '12  = 7.9%. He has had a high sugar diet and has been on minimal medication. He has been symptomatic with increase thirst and micturition. Recent labs reveal progressive poor control.  Plan No Sugar diet  D/C amaryl  Start Metformin 500 mg daily x 10 days then BID  Return OV for CBG 1 week.   Daughter will supervise his diet and medications.

## 2011-12-24 NOTE — Progress Notes (Signed)
Subjective:    Patient ID: Juan Carroll, male    DOB: 1928-03-06, 78 y.o.   MRN: [id number]  HPI Juan Carroll is seen acutely for A1C 14.3% on recent lab and serum glucose of 423. He is a poor historian but his daughter reports a high sugar diet: cookies, gatoraide, ice-cream bars, etc. He has had increased thirst and modest weight loss. His amaryl dose had been halved.  Past Medical History  Diagnosis Date  . Degeneration of intervertebral disc, site unspecified   . Occlusion and stenosis of carotid artery without mention of cerebral infarction   . Unspecified essential hypertension   . Inguinal hernia without mention of obstruction or gangrene, unilateral or unspecified, (not specified as recurrent)   . Memory loss   . Peripheral vascular disease, unspecified   . Type II or unspecified type diabetes mellitus without mention of complication, not stated as uncontrolled   . Coronary atherosclerosis of unspecified type of vessel, native or graft   . Elevated prostate specific antigen (PSA)   . Atrial fibrillation   . Other and unspecified hyperlipidemia   . Chronic atrial fibrillation     on rate control, medicines and coumadin  . Hypertension   . Hyperlipemia   . Carotid stenosis     status post left carotid endarterectomy in 2008   Past Surgical History  Procedure Date  . Flexible sigmoidoscopy 04-28-98  . Carotid duplex 11-15-06  . Electrocardiogram 11-02-06  . Carotid endartectomy     left  . Hemorrhoid surgery   . Inguinal hernia repair     right   Family History  Problem Relation Age of Onset  . Stroke Mother   . Parkinsonism Brother    History   Social History  . Marital Status: Widowed    Spouse Name: N/A    Number of Children: 2  . 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 Smoking status: Former Games developer  . Smokeless tobacco: Never Used  . Alcohol  Use: No  . Drug Use: No  . Sexually Active: Yes -- Male partner(s)   Other Topics Concern  . Not on file   Social History Narrative   Widowed; married in 1960. Enjoys playing lots of golf. Has a grown daughter (1962) who is his primary care-taker and son (1966), several grandchildren. 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 Discussed end of life care: he does not favor CPR or mechanical ventilator support.    Current Outpatient Prescriptions on File Prior to Visit  Medication Sig Dispense Refill  . aspirin 81 MG tablet Take 81 mg by mouth daily.        . digoxin (LANOXIN) 0.25 MG tablet Take 1 tablet (250 mcg total) by mouth daily.  90 tablet  1  . donepezil (ARICEPT) 10 MG tablet Take 1 tablet (10 mg total) by mouth at bedtime as needed.  30 tablet  1  . ramipril (ALTACE) 10 MG capsule Take 1 capsule (10 mg total) by mouth daily.  90 capsule  1  . simvastatin (ZOCOR) 20 MG tablet Take 1 tablet (20 mg total) by mouth at bedtime.  90 tablet  1  . warfarin (COUMADIN) 5 MG tablet Take as directed by Anticoagulation clinic  90 tablet  1  . metFORMIN (GLUCOPHAGE) 500  MG tablet Take 1 tablet (500 mg total) by mouth 2 (two) times daily with a meal.  60 tablet  5      Review of Systems System review is negative for any constitutional, cardiac, pulmonary, GI or neuro symptoms or complaints other than as described in the HPI.     Objective:   Physical Exam Filed Vitals:   12/23/11 1554  BP: 112/70  Pulse: 68  Temp: 97 F (36.1 C)  Resp: 16   Gen'l- tall, athletic appearing older white man in no distress Cor- RRR Pulm - normal respirations Neuro - awake, alert, speech clear, memory poor; CN II-XII grossly normal; normal gait.  CBG 416       Assessment & Plan:

## 2011-12-31 ENCOUNTER — Ambulatory Visit: Payer: Medicare Other | Admitting: Cardiovascular Disease

## 2012-01-07 ENCOUNTER — Ambulatory Visit (INDEPENDENT_AMBULATORY_CARE_PROVIDER_SITE_OTHER): Payer: Medicare Other | Admitting: *Deleted

## 2012-01-07 DIAGNOSIS — I4891 Unspecified atrial fibrillation: Secondary | ICD-10-CM

## 2012-01-07 LAB — POCT INR: INR: 2.8

## 2012-01-19 ENCOUNTER — Encounter: Payer: Self-pay | Admitting: General Practice

## 2012-01-28 ENCOUNTER — Encounter: Payer: Self-pay | Admitting: Cardiovascular Disease

## 2012-01-28 ENCOUNTER — Ambulatory Visit (INDEPENDENT_AMBULATORY_CARE_PROVIDER_SITE_OTHER): Payer: Medicare Other | Admitting: Cardiovascular Disease

## 2012-01-28 ENCOUNTER — Other Ambulatory Visit (INDEPENDENT_AMBULATORY_CARE_PROVIDER_SITE_OTHER): Payer: Medicare Other

## 2012-01-28 ENCOUNTER — Ambulatory Visit (INDEPENDENT_AMBULATORY_CARE_PROVIDER_SITE_OTHER): Payer: Medicare Other | Admitting: *Deleted

## 2012-01-28 VITALS — BP 138/78 | HR 65 | Ht 74.0 in | Wt 194.0 lb

## 2012-01-28 DIAGNOSIS — I4891 Unspecified atrial fibrillation: Secondary | ICD-10-CM

## 2012-01-28 DIAGNOSIS — E119 Type 2 diabetes mellitus without complications: Secondary | ICD-10-CM

## 2012-01-28 DIAGNOSIS — I25810 Atherosclerosis of coronary artery bypass graft(s) without angina pectoris: Secondary | ICD-10-CM

## 2012-01-28 DIAGNOSIS — I10 Essential (primary) hypertension: Secondary | ICD-10-CM

## 2012-01-28 DIAGNOSIS — I779 Disorder of arteries and arterioles, unspecified: Secondary | ICD-10-CM

## 2012-01-28 LAB — POCT INR: INR: 1.9

## 2012-01-28 NOTE — Progress Notes (Signed)
History of Present Illness: 78 yo WM with h/o of non-obstructive CAD, atrial fibrillation, carotid artery disease, mild dementia and HTN. He has been followed in the past by Dr. Juanda Carroll for his cardiac issues. I saw him last in August 2012.  He has chronic atrial fibrillation which is managed with rate control and Coumadin. He has documented nonobstructive coronary disease. He has had previous carotid endarterectomy. He is an avid Teacher, English as a foreign language.  He plays five days per week.   He is here for f/u. He denies chest pain, shortness of breath or palpitations. He is feeling great. Recent diagnosis of DM.   Primary Care Physician: Juan Carroll  Last Lipid Profile:Lipid Panel     Component Value Date/Time   CHOL 202* 12/21/2011 1630   TRIG 470.0* 12/21/2011 1630   HDL 56.90 12/21/2011 1630   CHOLHDL 4 12/21/2011 1630   VLDL 94.0* 12/21/2011 1630   LDLCALC 88 12/03/2010 0932     Past Medical History  Diagnosis Date  . Degeneration of intervertebral disc, site unspecified   . Occlusion and stenosis of carotid artery without mention of cerebral infarction   . Unspecified essential hypertension   . Inguinal hernia without mention of obstruction or gangrene, unilateral or unspecified, (not specified as recurrent)   . Memory loss   . Peripheral vascular disease, unspecified   . Type II or unspecified type diabetes mellitus without mention of complication, not stated as uncontrolled   . Coronary atherosclerosis of unspecified type of vessel, native or graft   . Elevated prostate specific antigen (PSA)   . Atrial fibrillation   . Other and unspecified hyperlipidemia   . Chronic atrial fibrillation     on rate control, medicines and coumadin  . Hypertension   . Hyperlipemia   . Carotid stenosis     status post left carotid endarterectomy in 2008    Past Surgical History  Procedure Date  . Flexible sigmoidoscopy 04-28-98  . Carotid duplex 11-15-06  . Electrocardiogram 11-02-06  . Carotid endartectomy    left  . Hemorrhoid surgery   . Inguinal hernia repair     right    Current Outpatient Prescriptions  Medication Sig Dispense Refill  . aspirin 81 MG tablet Take 81 mg by mouth daily.        . digoxin (LANOXIN) 0.25 MG tablet Take 1 tablet (250 mcg total) by mouth daily.  90 tablet  1  . donepezil (ARICEPT) 10 MG tablet Take 1 tablet (10 mg total) by mouth at bedtime as needed.  30 tablet  1  . metFORMIN (GLUCOPHAGE) 500 MG tablet Take 1 tablet (500 mg total) by mouth 2 (two) times daily with a meal.  60 tablet  5  . ramipril (ALTACE) 10 MG capsule Take 1 capsule (10 mg total) by mouth daily.  90 capsule  1  . simvastatin (ZOCOR) 20 MG tablet Take 1 tablet (20 mg total) by mouth at bedtime.  90 tablet  1  . warfarin (COUMADIN) 5 MG tablet Take as directed by Anticoagulation clinic  90 tablet  1    Allergies  Allergen Reactions  . Sulfa Antibiotics     History   Social History  . Marital Status: Widowed    Spouse Name: N/A    Number of Children: 2  . 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 Smoking status: Former Games developer  .  Smokeless tobacco: Never Used  . Alcohol Use: No  . Drug Use: No  . Sexually Active: Yes -- Male partner(s)   Other Topics Concern  . Not on file   Social History Narrative   Widowed; married in 1960. Enjoys playing lots of golf. Has a grown daughter (1962) who is his primary care-taker and son (1966), several grandchildren. 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 Discussed end of life care: he does not favor CPR or mechanical ventilator support.    Family History  Problem Relation Age of Onset  . Stroke Mother   . Parkinsonism Brother     Review of Systems:  As stated in the HPI and otherwise negative.   BP 138/78  Pulse 65  Ht 6\' 2"  (1.88 m)  Wt 194  lb (87.998 kg)  BMI 24.91 kg/m2  Physical Examination: General: Well developed, well nourished, NAD HEENT: OP clear, mucus membranes moist SKIN: warm, dry. No rashes. Neuro: No focal deficits Musculoskeletal: Muscle strength 5/5 all ext Psychiatric: Mood and affect normal Neck: No JVD, no carotid bruits, no thyromegaly, no lymphadenopathy. Lungs:Clear bilaterally, no wheezes, rhonci, crackles Cardiovascular:Irregular rate and rhythm. No murmurs, gallops or rubs. Abdomen:Soft. Bowel sounds present. Non-tender.  Extremities: No lower extremity edema. Pulses are 2 + in the bilateral DP/PT.  ZOX:WRUEAV fib, rate 65 bpm. Poor R wave progression precordial leads.   Assessment and Plan:   1. ATRIAL FIBRILLATION: Rate controlled on coumadin.   2. HYPERTENSION: Well controlled. No changes.   3. Carotid artery disease: Last dopplers September 2012 with stable bilateral 40-59% stenosis. F/U testing is due.   4. CORONARY ARTERY DISEASE: Stable.

## 2012-01-28 NOTE — Patient Instructions (Signed)
Your physician wants you to follow-up in: 12 months.   You will receive a reminder letter in the mail two months in advance. If you don't receive a letter, please call our office to schedule the follow-up appointment  Your physician has requested that you have a carotid duplex. This test is an ultrasound of the carotid arteries in your neck. It looks at blood flow through these arteries that supply the brain with blood. Allow one hour for this exam. There are no restrictions or special instructions.    

## 2012-02-25 ENCOUNTER — Encounter (INDEPENDENT_AMBULATORY_CARE_PROVIDER_SITE_OTHER): Payer: Medicare Other

## 2012-02-25 ENCOUNTER — Ambulatory Visit (INDEPENDENT_AMBULATORY_CARE_PROVIDER_SITE_OTHER): Payer: Medicare Other

## 2012-02-25 DIAGNOSIS — I4891 Unspecified atrial fibrillation: Secondary | ICD-10-CM

## 2012-02-25 DIAGNOSIS — I6529 Occlusion and stenosis of unspecified carotid artery: Secondary | ICD-10-CM

## 2012-03-01 ENCOUNTER — Other Ambulatory Visit: Payer: Self-pay

## 2012-03-01 MED ORDER — DONEPEZIL HCL 10 MG PO TABS: 10.0000 mg | ORAL_TABLET | Freq: Every evening | ORAL | Status: DC | PRN

## 2012-03-01 MED ORDER — WARFARIN SODIUM 5 MG PO TABS: ORAL_TABLET | ORAL | Status: DC

## 2012-03-02 ENCOUNTER — Other Ambulatory Visit: Payer: Self-pay | Admitting: *Deleted

## 2012-03-02 ENCOUNTER — Telehealth: Payer: Self-pay | Admitting: Cardiovascular Disease

## 2012-03-02 MED ORDER — WARFARIN SODIUM 5 MG PO TABS: ORAL_TABLET | ORAL | Status: DC

## 2012-03-02 MED ORDER — DONEPEZIL HCL 10 MG PO TABS: 10.0000 mg | ORAL_TABLET | Freq: Every evening | ORAL | Status: DC | PRN

## 2012-03-02 NOTE — Telephone Encounter (Signed)
pt daughter Olegario Messier [phone] returning nurse call

## 2012-03-02 NOTE — Telephone Encounter (Signed)
Spoke with daughter and reviewed pt's carotid doppler results with her.

## 2012-03-06 ENCOUNTER — Other Ambulatory Visit (INDEPENDENT_AMBULATORY_CARE_PROVIDER_SITE_OTHER): Payer: Medicare Other

## 2012-03-06 ENCOUNTER — Encounter: Payer: Self-pay | Admitting: Internal Medicine

## 2012-03-06 ENCOUNTER — Ambulatory Visit (INDEPENDENT_AMBULATORY_CARE_PROVIDER_SITE_OTHER): Payer: Medicare Other | Admitting: Internal Medicine

## 2012-03-06 VITALS — BP 132/78 | HR 71 | Temp 97.1°F | Resp 16 | Wt 191.0 lb

## 2012-03-06 DIAGNOSIS — I10 Essential (primary) hypertension: Secondary | ICD-10-CM

## 2012-03-06 DIAGNOSIS — Z23 Encounter for immunization: Secondary | ICD-10-CM

## 2012-03-06 DIAGNOSIS — L578 Other skin changes due to chronic exposure to nonionizing radiation: Secondary | ICD-10-CM

## 2012-03-06 DIAGNOSIS — I4891 Unspecified atrial fibrillation: Secondary | ICD-10-CM

## 2012-03-06 DIAGNOSIS — R413 Other amnesia: Secondary | ICD-10-CM

## 2012-03-06 DIAGNOSIS — E785 Hyperlipidemia, unspecified: Secondary | ICD-10-CM

## 2012-03-06 DIAGNOSIS — I2510 Atherosclerotic heart disease of native coronary artery without angina pectoris: Secondary | ICD-10-CM

## 2012-03-06 DIAGNOSIS — R972 Elevated prostate specific antigen [PSA]: Secondary | ICD-10-CM

## 2012-03-06 DIAGNOSIS — E119 Type 2 diabetes mellitus without complications: Secondary | ICD-10-CM

## 2012-03-06 DIAGNOSIS — I6529 Occlusion and stenosis of unspecified carotid artery: Secondary | ICD-10-CM

## 2012-03-06 DIAGNOSIS — Z0000 Encounter for general adult medical examination without abnormal findings: Secondary | ICD-10-CM

## 2012-03-06 LAB — COMPREHENSIVE METABOLIC PANEL
Albumin: 3.7 g/dL (ref 3.5–5.2)
CO2: 30 mEq/L (ref 19–32)
Calcium: 9 mg/dL (ref 8.4–10.5)
GFR: 71.85 mL/min (ref >60.00)
Glucose, Bld: 162 mg/dL — ABNORMAL HIGH (ref 70–99)
Potassium: 4.4 mEq/L (ref 3.5–5.1)
Sodium: 136 mEq/L (ref 135–145)
Total Bilirubin: 0.7 mg/dL (ref 0.3–1.2)
Total Protein: 6.6 g/dL (ref 6.0–8.3)

## 2012-03-06 LAB — HEMOGLOBIN A1C: Hgb A1c MFr Bld: 9.2 % — ABNORMAL HIGH (ref 4.6–6.5)

## 2012-03-06 MED ORDER — RAMIPRIL 10 MG PO CAPS: 10.0000 mg | ORAL_CAPSULE | Freq: Every day | ORAL | Status: DC

## 2012-03-06 MED ORDER — SIMVASTATIN 20 MG PO TABS: 20.0000 mg | ORAL_TABLET | Freq: Every day | ORAL | Status: DC

## 2012-03-06 MED ORDER — DIGOXIN 250 MCG PO TABS: 250.0000 ug | ORAL_TABLET | Freq: Every day | ORAL | Status: DC

## 2012-03-06 NOTE — Patient Instructions (Addendum)
Good exam. Keep up the good work on the diet for blood sugar control. Everyt two weeks use some ear wax removal drops, let dwell for 5 minutes then flush with warm water using a bulb syringe.  Continue all present medications.  Carotid dopplers show no change in obstructive disease and a repeat is needed in 1 year.  Come see me when you need me.
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
## 2012-03-07 NOTE — Assessment & Plan Note (Signed)
A1C 11.7 - 9.2% diet plus metformin. Elevated microalbuminuria - on ACE-I. Lab error - was to have A1C in December - will still need to have A1C in December. He is doing very well: his daughter has been vigilant about a sugar free diet and he has been very cooperative.   Plan Continue sugar free diet  Continue metformin.

## 2012-03-07 NOTE — Assessment & Plan Note (Signed)
December 21, 2011 PSA 3.96 - in normal range. At 51 he does not require prostate cancer screening in the absence of symptoms (ACU guidelines April '13)

## 2012-03-07 NOTE — Assessment & Plan Note (Addendum)
Increasing problem - family is great and will arrange for transportation so that he can continue to play golf daily. He is closely supervised and cared for at home.   Plan Continue aricept  "GPS" watch is a great idea.

## 2012-03-07 NOTE — Assessment & Plan Note (Signed)
BP Readings from Last 3 Encounters:  03/06/12 132/78  01/28/12 138/78  12/23/11 112/70   Good control on present medications. Electrolyes and renal function are normal

## 2012-03-07 NOTE — Assessment & Plan Note (Signed)
Very stable. He has recently seen Dr. Sanjuana Kava who feels he is doing well.  Plan Continue present risk reduction

## 2012-03-07 NOTE — Assessment & Plan Note (Signed)
Patient is followed by dermatology and will have an up-coming visit for routine follow-up. No suspicious lesions noted at today's visit.

## 2012-03-07 NOTE — Assessment & Plan Note (Signed)
Carotid Doppler Oct 11th, '13 - 40-59% bilateral ICA stenosis, unchanged from '12.  Plan Repeat study 1 year  Continue anticoagulation - currently on warfarin

## 2012-03-07 NOTE — Assessment & Plan Note (Signed)
Remains in rate controlled A. Fib.  Plan Continue anti-coagulation , may be a candidate for Xeralto or Equis

## 2012-03-07 NOTE — Assessment & Plan Note (Signed)
Last lab with LDL 85 - better than goal of 100 or less.  Plan - continue simvastatin 20 mg daily.
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
## 2012-03-09 ENCOUNTER — Encounter: Payer: Self-pay | Admitting: Internal Medicine

## 2012-03-14 ENCOUNTER — Other Ambulatory Visit: Payer: Self-pay | Admitting: *Deleted

## 2012-03-14 ENCOUNTER — Telehealth: Payer: Self-pay | Admitting: *Deleted

## 2012-03-14 DIAGNOSIS — E119 Type 2 diabetes mellitus without complications: Secondary | ICD-10-CM

## 2012-03-14 NOTE — Telephone Encounter (Signed)
Future Lab work for HgbA1c ordered.for December.2013

## 2012-03-14 NOTE — Telephone Encounter (Signed)
Spoke with Vickie in lab to credit HgbA1C for 03/06/2012. Order is for December.

## 2012-03-14 NOTE — Telephone Encounter (Signed)
Message copied by Elnora Morrison on Tue Mar 14, 2012  2:26 PM ------      Message from: Jacques Navy      Created: Tue Mar 07, 2012  3:37 AM       Call lab - he is to be credited for A1C which was not for 10/21 but is ordered for December. Be sure December order is still active.

## 2012-03-17 ENCOUNTER — Ambulatory Visit (INDEPENDENT_AMBULATORY_CARE_PROVIDER_SITE_OTHER): Payer: Medicare Other | Admitting: *Deleted

## 2012-03-17 DIAGNOSIS — I4891 Unspecified atrial fibrillation: Secondary | ICD-10-CM

## 2012-04-10 ENCOUNTER — Ambulatory Visit (INDEPENDENT_AMBULATORY_CARE_PROVIDER_SITE_OTHER): Payer: Medicare Other | Admitting: Cardiovascular Disease

## 2012-04-10 ENCOUNTER — Encounter: Payer: Self-pay | Admitting: Cardiovascular Disease

## 2012-04-10 ENCOUNTER — Ambulatory Visit (INDEPENDENT_AMBULATORY_CARE_PROVIDER_SITE_OTHER): Payer: Medicare Other | Admitting: *Deleted

## 2012-04-10 VITALS — BP 164/80 | HR 68 | Ht 74.0 in | Wt 191.0 lb

## 2012-04-10 DIAGNOSIS — I4891 Unspecified atrial fibrillation: Secondary | ICD-10-CM

## 2012-04-10 DIAGNOSIS — I2510 Atherosclerotic heart disease of native coronary artery without angina pectoris: Secondary | ICD-10-CM

## 2012-04-10 LAB — CBC WITH DIFFERENTIAL/PLATELET
Eosinophils Absolute: 0.1 10*3/uL (ref 0.0–0.7)
MCHC: 32.7 g/dL (ref 30.0–36.0)
MCV: 97.4 fl (ref 78.0–100.0)
Monocytes Absolute: 0.9 10*3/uL (ref 0.1–1.0)
Neutrophils Relative %: 68.7 % (ref 43.0–77.0)
Platelets: 207 10*3/uL (ref 150.0–400.0)
RDW: 13.2 % (ref 11.5–14.6)

## 2012-04-10 LAB — BASIC METABOLIC PANEL
Calcium: 9.1 mg/dL (ref 8.4–10.5)
GFR: 64.03 mL/min (ref >60.00)
Potassium: 4.5 mEq/L (ref 3.5–5.1)
Sodium: 133 mEq/L — ABNORMAL LOW (ref 135–145)

## 2012-04-10 MED ORDER — RIVAROXABAN 20 MG PO TABS: 20.0000 mg | ORAL_TABLET | Freq: Every day | ORAL | Status: DC

## 2012-04-10 NOTE — Patient Instructions (Addendum)
Your physician wants you to follow-up in: 6 months. You will receive a reminder letter in the mail two months in advance. If you don't receive a letter, please call our office to schedule the follow-up appointment.  Keep scheduled appt with Coumadin clinic on May 08, 2012.  This will be for monitoring of Xarelto.   Your physician has recommended you make the following change in your medication: Stop Coumadin. Start Xarelto 20 mg by mouth daily.

## 2012-04-10 NOTE — Progress Notes (Signed)
History of Present Illness: 78 yo WM with h/o of non-obstructive CAD, atrial fibrillation, carotid artery disease, mild dementia, DM and HTN here today for cardiac follow up. He has been followed in the past by Dr. Juanda Chance for his cardiac issues. I saw him last in August 2012. He has chronic atrial fibrillation which is managed with rate control and Coumadin. He has documented nonobstructive coronary disease. He has had previous carotid endarterectomy. 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   He is here for cardiac follow and wishes to discuss changing to one of the newer anti-coagulants. He denies chest pain, shortness of breath or palpitations. He is feeling great.  Primary Care Physician: Illene Regulus  Last Lipid Profile:Lipid Panel     Component Value Date/Time   CHOL 202* 12/21/2011 1630   TRIG 470.0* 12/21/2011 1630   HDL 56.90 12/21/2011 1630   CHOLHDL 4 12/21/2011 1630   VLDL 94.0* 12/21/2011 1630   LDLCALC 88 12/03/2010 0932     Past Medical History  Diagnosis Date  . Degeneration of intervertebral disc, site unspecified   . Occlusion and stenosis of carotid artery without mention of cerebral infarction   . Unspecified essential hypertension   . Inguinal hernia without mention of obstruction or gangrene, unilateral or unspecified, (not specified as recurrent)   . Memory loss   . Peripheral vascular disease, unspecified   . Type II or unspecified type diabetes mellitus without mention of complication, not stated as uncontrolled   . Coronary atherosclerosis of unspecified type of vessel, native or graft   . Elevated prostate specific antigen (PSA)   . Atrial fibrillation   . Other and unspecified hyperlipidemia   . Chronic atrial fibrillation     on rate control, medicines and coumadin  . Hypertension   . Hyperlipemia   . Carotid stenosis     status post left carotid endarterectomy in 2008    Past Surgical History  Procedure Date  . Flexible sigmoidoscopy  04-28-98  . Carotid duplex 11-15-06  . Electrocardiogram 11-02-06  . Carotid endartectomy     left  . Hemorrhoid surgery   . Inguinal hernia repair     right    Current Outpatient Prescriptions  Medication Sig Dispense Refill  . aspirin 81 MG tablet Take 81 mg by mouth daily.        . digoxin (LANOXIN) 0.25 MG tablet Take 1 tablet (250 mcg total) by mouth daily.  90 tablet  3  . donepezil (ARICEPT) 10 MG tablet Take 1 tablet (10 mg total) by mouth at bedtime as needed.  90 tablet  3  . metFORMIN (GLUCOPHAGE) 500 MG tablet Take 1 tablet (500 mg total) by mouth 2 (two) times daily with a meal.  60 tablet  5  . ramipril (ALTACE) 10 MG capsule Take 1 capsule (10 mg total) by mouth daily.  90 capsule  3  . simvastatin (ZOCOR) 20 MG tablet Take 1 tablet (20 mg total) by mouth at bedtime.  90 tablet  3  . warfarin (COUMADIN) 5 MG tablet Take as directed by Anticoagulation clinic  90 tablet  3    Allergies  Allergen Reactions  . Sulfa Antibiotics     History   Social History  . Marital Status: Widowed    Spouse Name: N/A    Number of Children: 2  . Years of Education: N/A   Occupational History  . 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 Smoking status: Former Games developer  . Smokeless tobacco: Never Used  . Alcohol Use: No  . Drug Use: No  . Sexually Active: Yes -- Male partner(s)   Other Topics Concern  . Not on file   Social History Narrative   Widowed; married in 1960. Enjoys playing lots of golf. Has a grown daughter (1962) who is his primary care-taker and son (1966), several grandchildren. 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 Discussed end of life care: he does not favor CPR or mechanical ventilator support.    Family History  Problem Relation Age of Onset  . Stroke Mother   . Parkinsonism Brother     Review of Systems:  As  stated in the HPI and otherwise negative.   BP 164/80  Pulse 68  Ht 6\' 2"  (1.88 m)  Wt 191 lb (86.637 kg)  BMI 24.52 kg/m2  Physical Examination: General: Well developed, well nourished, NAD HEENT: OP clear, mucus membranes moist SKIN: warm, dry. No rashes. Neuro: No focal deficits Musculoskeletal: Muscle strength 5/5 all ext Psychiatric: Mood and affect normal Neck: No JVD, no carotid bruits, no thyromegaly, no lymphadenopathy. Lungs:Clear bilaterally, no wheezes, rhonci, crackles Cardiovascular: Irregular rhythm. No murmurs, gallops or rubs. Abdomen:Soft. Bowel sounds present. Non-tender.  Extremities: No lower extremity edema. Pulses are 2 + in the bilateral DP/PT.  Assessment and Plan:   1. ATRIAL FIBRILLATION: Rate controlled. He is on coumadin and having no issues but wishes to change to Xarelto. Will check BMET, CBC today and transition to Xarelto 20 mg po Qdaily. Will f/u in coumadin clinic in 4 weeks.   2. HYPERTENSION: Has been well controlled. Slightly elevated today. No changes   3. Carotid artery disease: Last dopplers October 2013 with stable bilateral 40-59% stenosis.   4. CORONARY ARTERY DISEASE: Stable.

## 2012-04-12 ENCOUNTER — Telehealth: Payer: Self-pay | Admitting: Cardiovascular Disease

## 2012-04-12 NOTE — Telephone Encounter (Signed)
Pt daughter rtn call from yesterday

## 2012-04-12 NOTE — Telephone Encounter (Signed)
Spoke with daughter and reviewed lab results from 11/25

## 2012-05-08 ENCOUNTER — Ambulatory Visit (INDEPENDENT_AMBULATORY_CARE_PROVIDER_SITE_OTHER): Payer: Medicare Other | Admitting: *Deleted

## 2012-05-08 ENCOUNTER — Other Ambulatory Visit: Payer: Self-pay | Admitting: *Deleted

## 2012-05-08 DIAGNOSIS — I4891 Unspecified atrial fibrillation: Secondary | ICD-10-CM

## 2012-05-08 DIAGNOSIS — E119 Type 2 diabetes mellitus without complications: Secondary | ICD-10-CM

## 2012-05-08 LAB — BASIC METABOLIC PANEL
CO2: 27 mEq/L (ref 19–32)
Calcium: 9 mg/dL (ref 8.4–10.5)
Chloride: 98 mEq/L (ref 96–112)
Glucose, Bld: 221 mg/dL — ABNORMAL HIGH (ref 70–99)
Sodium: 134 mEq/L — ABNORMAL LOW (ref 135–145)

## 2012-05-08 LAB — CBC
HCT: 42.8 % (ref 39.0–52.0)
Hemoglobin: 14.6 g/dL (ref 13.0–17.0)
RBC: 4.49 Mil/uL (ref 4.22–5.81)
WBC: 9.1 10*3/uL (ref 4.5–10.5)

## 2012-05-08 MED ORDER — RIVAROXABAN 20 MG PO TABS: 20.0000 mg | ORAL_TABLET | Freq: Every day | ORAL | Status: DC

## 2012-05-08 NOTE — Progress Notes (Signed)
Quick Note:  Please report to patient. The recent labs are stable. Continue same medication and careful diet. His white count and hemoglobin are normal. His blood sugar is higher and he needs to be more careful with his diabetic diet. ______

## 2012-05-08 NOTE — Progress Notes (Signed)
Pt was started on Xarelto for Afib on 04/10/12.    Reviewed patients medication list.  Pt is not  currently on any combined P-gp and strong CYP3A4 inhibitors/inducers (ketoconazole, traconazole, ritonavir, carbamazepine, phenytoin, rifampin, St. John's wort).  Reviewed labs.  SCr 1.2 , Weight 86.6 kg, CrCl- 1.2.  Dose  appropriate based on CrCl.   Hgb and HCT 14.6/42.8 .  A full discussion of the nature of anticoagulants has been carried out.  A benefit/risk analysis has been presented to the patient, so that they understand the justification for choosing anticoagulation with Xarelto at this time.  The need for compliance is stressed.  Pt is aware to take the medication once daily with the largest meal of the day.  Side effects of potential bleeding are discussed, including unusual colored urine or stools, coughing up blood or coffee ground emesis, nose bleeds or serious fall or head trauma.  Discussed signs and symptoms of stroke. The patient should avoid any OTC items containing aspirin or ibuprofen.  Avoid alcohol consumption.   Call if any signs of abnormal bleeding.  Discussed financial obligations and resolved any difficulty in obtaining medication.  Next lab test test in 6 months.

## 2012-05-09 ENCOUNTER — Telehealth: Payer: Self-pay | Admitting: *Deleted

## 2012-05-09 NOTE — Telephone Encounter (Signed)
Message copied by Burnell Blanks on Tue May 09, 2012  8:36 AM ------      Message from: Cassell Clement      Created: Mon May 08, 2012  2:54 PM       Please report to patient.  The recent labs are stable. Continue same medication and careful diet.  His white count and hemoglobin are normal.  His blood sugar is higher and he needs to be more careful with his diabetic diet.

## 2012-05-09 NOTE — Telephone Encounter (Signed)
Mailed copy of labs and left message to call if any questions  

## 2012-05-12 ENCOUNTER — Other Ambulatory Visit: Payer: Self-pay | Admitting: Internal Medicine

## 2012-05-12 MED ORDER — METFORMIN HCL 1000 MG PO TABS: 1000.0000 mg | ORAL_TABLET | Freq: Two times a day (BID) | ORAL | Status: DC

## 2012-05-12 NOTE — Addendum Note (Signed)
Addended by: Illene Regulus E on: 05/12/2012 11:10 AM   Modules accepted: Orders

## 2012-07-12 ENCOUNTER — Telehealth: Payer: Self-pay | Admitting: Cardiovascular Disease

## 2012-07-12 NOTE — Telephone Encounter (Signed)
Left message to call back  

## 2012-07-12 NOTE — Telephone Encounter (Signed)
Daughter rtn someone's call to makes appt for pt regarding xaralto not sure who coumadin said they did not call

## 2012-07-19 NOTE — Telephone Encounter (Signed)
Left message to call back  

## 2012-08-01 NOTE — Telephone Encounter (Signed)
Left message to call back  

## 2012-08-03 ENCOUNTER — Telehealth: Payer: Self-pay | Admitting: Cardiovascular Disease

## 2012-08-03 ENCOUNTER — Encounter: Payer: Self-pay | Admitting: Cardiovascular Disease

## 2012-08-03 NOTE — Telephone Encounter (Signed)
New problem   Pt was returning a call from [facility] on yesterday 08/02/12.

## 2012-08-03 NOTE — Telephone Encounter (Signed)
Pt called in error. 

## 2012-08-07 NOTE — Telephone Encounter (Signed)
Per my chart message daughter has no questions regarding Xarelto appt.

## 2012-08-10 ENCOUNTER — Other Ambulatory Visit: Payer: Self-pay | Admitting: *Deleted

## 2012-08-10 DIAGNOSIS — I4891 Unspecified atrial fibrillation: Secondary | ICD-10-CM

## 2012-08-10 MED ORDER — DIGOXIN 125 MCG PO TABS: 125.0000 ug | ORAL_TABLET | Freq: Every day | ORAL | Status: DC

## 2012-08-30 ENCOUNTER — Ambulatory Visit: Payer: Self-pay | Admitting: Cardiovascular Disease

## 2012-08-30 DIAGNOSIS — I4891 Unspecified atrial fibrillation: Secondary | ICD-10-CM

## 2012-09-19 ENCOUNTER — Encounter: Payer: Self-pay | Admitting: Pharmacist

## 2012-10-23 ENCOUNTER — Telehealth: Payer: Self-pay

## 2012-10-23 MED ORDER — METFORMIN HCL 1000 MG PO TABS: 1000.0000 mg | ORAL_TABLET | Freq: Two times a day (BID) | ORAL | Status: DC

## 2012-10-23 NOTE — Telephone Encounter (Signed)
Pt's daughter call for refill of Metformin 1000 mg.

## 2012-11-22 ENCOUNTER — Other Ambulatory Visit (INDEPENDENT_AMBULATORY_CARE_PROVIDER_SITE_OTHER): Payer: Medicare Other

## 2012-11-22 ENCOUNTER — Encounter: Payer: Self-pay | Admitting: Internal Medicine

## 2012-11-22 ENCOUNTER — Ambulatory Visit (INDEPENDENT_AMBULATORY_CARE_PROVIDER_SITE_OTHER): Payer: Medicare Other | Admitting: Internal Medicine

## 2012-11-22 VITALS — BP 140/76 | HR 61 | Temp 96.7°F | Resp 12 | Wt 185.8 lb

## 2012-11-22 DIAGNOSIS — E119 Type 2 diabetes mellitus without complications: Secondary | ICD-10-CM

## 2012-11-22 DIAGNOSIS — I2510 Atherosclerotic heart disease of native coronary artery without angina pectoris: Secondary | ICD-10-CM

## 2012-11-22 DIAGNOSIS — I10 Essential (primary) hypertension: Secondary | ICD-10-CM

## 2012-11-22 DIAGNOSIS — R972 Elevated prostate specific antigen [PSA]: Secondary | ICD-10-CM

## 2012-11-22 DIAGNOSIS — R413 Other amnesia: Secondary | ICD-10-CM

## 2012-11-22 DIAGNOSIS — Z23 Encounter for immunization: Secondary | ICD-10-CM

## 2012-11-22 DIAGNOSIS — Z2911 Encounter for prophylactic immunotherapy for respiratory syncytial virus (RSV): Secondary | ICD-10-CM

## 2012-11-22 DIAGNOSIS — E785 Hyperlipidemia, unspecified: Secondary | ICD-10-CM

## 2012-11-22 LAB — COMPREHENSIVE METABOLIC PANEL
ALT: 20 U/L (ref 0–53)
AST: 23 U/L (ref 0–37)
Albumin: 4.2 g/dL (ref 3.5–5.2)
BUN: 21 mg/dL (ref 6–23)
CO2: 27 mEq/L (ref 19–32)
Calcium: 9.5 mg/dL (ref 8.4–10.5)
Chloride: 100 mEq/L (ref 96–112)
Creatinine, Ser: 1.2 mg/dL (ref 0.4–1.5)
GFR: 62.68 mL/min (ref >60.00)
Potassium: 4.5 mEq/L (ref 3.5–5.1)

## 2012-11-22 LAB — LIPID PANEL
Total CHOL/HDL Ratio: 3
Triglycerides: 124 mg/dL (ref 0.0–149.0)

## 2012-11-22 LAB — HEMOGLOBIN AND HEMATOCRIT, BLOOD: Hemoglobin: 15.1 g/dL (ref 13.0–17.0)

## 2012-11-22 LAB — HEPATIC FUNCTION PANEL
AST: 23 U/L (ref 0–37)
Albumin: 4.2 g/dL (ref 3.5–5.2)
Alkaline Phosphatase: 108 U/L (ref 39–117)
Bilirubin, Direct: 0.2 mg/dL (ref 0.0–0.3)
Total Bilirubin: 1.2 mg/dL (ref 0.3–1.2)

## 2012-11-22 LAB — HEMOGLOBIN A1C: Hgb A1c MFr Bld: 7.8 % — ABNORMAL HIGH (ref 4.6–6.5)

## 2012-11-22 NOTE — Patient Instructions (Addendum)
Thanks for coming in. You appear to be medically stable and doing well.  Try your best to stay on the no sugar, low carb diet.  Will check labs today: A1C, lipids, kidney, thyroid. Results will be on MyChart and Dr., Sanjuana Kava will have access to this data.  Vaccines today: Tetanus and Shingles vaccine.

## 2012-11-22 NOTE — Progress Notes (Signed)
Subjective:    Patient ID: Juan Carroll, male    DOB: [birth year], 79 y.o.   MRN: [id number]  HPI [name] presents for annual Medicare wellness examination and management of other chronic and acute problems. He is feeling good, doing good, playing golf when he can. He still is wearing his tracker watch. No events reported by his daughter. He has been put on Xarelto.    The risk factors are reflected in the social history.  The roster of all physicians providing medical care to patient - is listed in the Snapshot section of the chart.  Activities of daily living:  The patient is 100% inedpendent in all ADLs: dressing, toileting, feeding as well as independent mobility  Home safety : The patient has smoke detectors in the home. They wear seatbelts. No firearms at home . There is no violence in the home.   There is no risks for hepatitis, STDs or HIV. There is no history of blood transfusion. They have no travel history to infectious disease endemic areas of the world.  The patient has  seen their dentist in the last six month. They have seen their eye doctor in the last year. They deny any hearing difficulty and have not had audiologic testing in the last year.    They do have excessive sun exposure. Discussed the need for sun protection: hats, long sleeves and use of sunscreen if there is significant sun exposure.   Diet: the importance of a healthy diet is discussed. They do have a healthy but he does like to snack and maybe cheat diet.  The patient has a regular exercise program: golf , 60 min duration, 3 per week.  The benefits of regular aerobic exercise were discussed.  Depression screen: there are no signs or vegative symptoms of depression- irritability, change in appetite, anhedonia, sadness/tearfullness.  Cognitive assessment: the patient does not manage their financial or personal affairs  The following portions of the patient's history were reviewed and updated as  appropriate: allergies, current medications, past family history, past medical history,  past surgical history, past social history  and problem list.  Vision, hearing, body mass index were assessed and reviewed.   During the course of the visit the patient was educated and counseled about appropriate screening and preventive services including : fall prevention , diabetes screening, nutrition counseling, colorectal cancer screening, and recommended immunizations. .   Past Medical History  Diagnosis Date  . Degeneration of intervertebral disc, site unspecified   . Occlusion and stenosis of carotid artery without mention of cerebral infarction   . Unspecified essential hypertension   . Inguinal hernia without mention of obstruction or gangrene, unilateral or unspecified, (not specified as recurrent)   . Memory loss   . Peripheral vascular disease, unspecified   . Type II or unspecified type diabetes mellitus without mention of complication, not stated as uncontrolled   . Coronary atherosclerosis of unspecified type of vessel, native or graft   . Elevated prostate specific antigen (PSA)   . Atrial fibrillation   . Other and unspecified hyperlipidemia   . Chronic atrial fibrillation     on rate control, medicines and coumadin  . Hypertension   . Hyperlipemia   . Carotid stenosis     status post left carotid endarterectomy in 2008   Past Surgical History  Procedure Laterality Date  . Flexible sigmoidoscopy  04-28-98  . Carotid duplex  11-15-06  . Electrocardiogram  11-02-06  . Carotid endartectomy  left  . Hemorrhoid surgery    . Inguinal hernia repair      right   Family History  Problem Relation Age of Onset  . Stroke Mother   . Parkinsonism Brother    History   Social History  . Marital Status: Widowed    Spouse Name: N/A    Number of Children: 2  . Years of Education: N/A   Occupational History  . 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 Smoking status: Former Games developer  . Smokeless tobacco: Never Used  . Alcohol Use: No  . Drug Use: No  . Sexually Active: Yes -- Male partner(s)   Other Topics Concern  . Not on file   Social History Narrative   Widowed; married in 1960. Enjoys playing lots of golf. Has a grown daughter (1962) who is his primary care-taker and son (1966), several grandchildren. 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 Discussed end of life care: he does not favor CPR or mechanical ventilator support.    Current Outpatient Prescriptions on File Prior to Visit  Medication Sig Dispense Refill  . aspirin 81 MG tablet Take 81 mg by mouth daily.        . digoxin (LANOXIN) 0.125 MG tablet Take 1 tablet (125 mcg total) by mouth daily.  90 tablet  3  . donepezil (ARICEPT) 10 MG tablet Take 1 tablet (10 mg total) by mouth at bedtime as needed.  90 tablet  3  . metFORMIN (GLUCOPHAGE) 1000 MG tablet Take 1 tablet (1,000 mg total) by mouth 2 (two) times daily with a meal.  60 tablet  5  . ramipril (ALTACE) 10 MG capsule Take 1 capsule (10 mg total) by mouth daily.  90 capsule  3  . Rivaroxaban (XARELTO) 20 MG TABS Take 1 tablet (20 mg total) by mouth daily.  90 tablet  2  . simvastatin (ZOCOR) 20 MG tablet Take 1 tablet (20 mg total) by mouth at bedtime.  90 tablet  3   No current facility-administered medications on file prior to visit.      Review of Systems System review is negative for any constitutional, cardiac, pulmonary, GI or neuro symptoms or complaints other than as described in the HPI.     Objective:   Physical Exam Filed Vitals:   11/22/12 1311  BP: 140/76  Pulse: 61  Temp: 96.7 F (35.9 C)  Resp: 12   Wt Readings from Last 3 Encounters:  11/22/12 185 lb 12.8 oz (84.278 kg)  04/10/12 191 lb (86.637 kg)  03/06/12 191 lb (86.637 kg)   Gen'l: Well nourished well  developed elderly white male in no acute distress  HEENT: Head: Normocephalic and atraumatic. Right Ear: External ear normal. EAC/TM nl. Left Ear: External ear normal.  EAC/TM nl. Nose: Nose normal. Mouth/Throat: Oropharynx is clear and moist. Dentition - native, in good repair. No buccal or palatal lesions. Posterior pharynx clear. Eyes: Conjunctivae and sclera clear. EOM intact. Pupils are equal, round, and reactive to light. Right eye exhibits no discharge. Left eye exhibits no discharge. Neck: Normal range of motion. Neck supple. No JVD present. No tracheal deviation present. No thyromegaly present.  Cardiovascular: Normal rate, regular rhythm, no gallop, no friction rub, no murmur heard.      Quiet precordium. 2+ radial and DP pulses . No carotid bruits  Pulmonary/Chest: Effort normal. No respiratory distress or increased WOB, no wheezes, no rales. No chest wall deformity or CVAT. Abdomen: Soft. Bowel sounds are normal in all quadrants. He exhibits no distension, no tenderness, no rebound or guarding, No heptosplenomegaly  Genitourinary:  deferrred Musculoskeletal: Normal range of motion. He exhibits no edema and no tenderness.       Small and large joints without redness, synovial thickening or deformity. Full range of motion preserved about all small, median and large joints.  Lymphadenopathy:    He has no cervical or supraclavicular adenopathy.  Neurological: He is alert and oriented to person, place, and time. CN II-XII intact. DTRs 2+ and symmetrical biceps, radial and patellar tendons. Cerebellar function normal with no tremor, rigidity, normal gait and station.  Skin: Skin is warm and dry. No rash noted. No erythema.  Psychiatric: He has a normal mood and affect. His behavior is normal. Thought content normal.  .  Recent Results (from the past 2160 hour(s))  HEMOGLOBIN A1C     Status: Abnormal   Collection Time    11/22/12  1:56 PM      Result Value Range   Hemoglobin A1C 7.8 (*) 4.6  - 6.5 %   Comment: Glycemic Control Guidelines for People with Diabetes:Non Diabetic:  <6%Goal of Therapy: <7%Additional Action Suggested:  >8%   HEPATIC FUNCTION PANEL     Status: None   Collection Time    11/22/12  1:56 PM      Result Value Range   Total Bilirubin 1.2  0.3 - 1.2 mg/dL   Bilirubin, Direct 0.2  0.0 - 0.3 mg/dL   Alkaline Phosphatase 108  39 - 117 U/L   AST 23  0 - 37 U/L   ALT 20  0 - 53 U/L   Total Protein 7.0  6.0 - 8.3 g/dL   Albumin 4.2  3.5 - 5.2 g/dL  TSH     Status: None   Collection Time    11/22/12  1:56 PM      Result Value Range   TSH 1.26  0.35 - 5.50 uIU/mL  COMPREHENSIVE METABOLIC PANEL     Status: Abnormal   Collection Time    11/22/12  1:56 PM      Result Value Range   Sodium 137  135 - 145 mEq/L   Potassium 4.5  3.5 - 5.1 mEq/L   Chloride 100  96 - 112 mEq/L   CO2 27  19 - 32 mEq/L   Glucose, Bld 168 (*) 70 - 99 mg/dL   BUN 21  6 - 23 mg/dL   Creatinine, Ser 1.2  0.4 - 1.5 mg/dL   Total Bilirubin 1.2  0.3 - 1.2 mg/dL   Alkaline Phosphatase 108  39 - 117 U/L   AST 23  0 - 37 U/L   ALT 20  0 - 53 U/L   Total Protein 7.0  6.0 - 8.3 g/dL   Albumin 4.2  3.5 - 5.2 g/dL   Calcium 9.5  8.4 - 16.1 mg/dL   GFR 09.60  >45.40 mL/min  LIPID PANEL     Status: None   Collection Time    11/22/12  1:56 PM      Result Value Range   Cholesterol 152  0 - 200 mg/dL   Comment: ATP III Classification       Desirable:  < 200 mg/dL               Borderline High:  200 -  239 mg/dL          High:  > = 161 mg/dL   Triglycerides 096.0  0.0 - 149.0 mg/dL   Comment: Normal:  <454 mg/dLBorderline High:  150 - 199 mg/dL   HDL 09.81  >19.14 mg/dL   VLDL 78.2  0.0 - 95.6 mg/dL   LDL Cholesterol 74  0 - 99 mg/dL   Total CHOL/HDL Ratio 3     Comment:                Men          Women1/2 Average Risk     3.4          3.3Average Risk          5.0          4.42X Average Risk          9.6          7.13X Average Risk          15.0          11.0                      HEMOGLOBIN  AND HEMATOCRIT, BLOOD     Status: None   Collection Time    11/22/12  1:56 PM      Result Value Range   Hemoglobin 15.1  13.0 - 17.0 g/dL   HCT 21.3  08.6 - 57.8 %        Assessment & Plan:

## 2012-11-25 NOTE — Assessment & Plan Note (Signed)
Closely supervised by his family and he is doing well. He remains I-ADLs

## 2012-11-25 NOTE — Assessment & Plan Note (Signed)
Takes and tolerates his medications BP Readings from Last 3 Encounters:  11/22/12 140/76  04/10/12 164/80  03/06/12 132/78   Adequate control on present medication.

## 2012-11-25 NOTE — Assessment & Plan Note (Signed)
Taking his medication and his diet is supervised.  Plan Follow up lab.  Addendum: LDL 74!!

## 2012-11-25 NOTE — Assessment & Plan Note (Signed)
Stable with no c/o chest pain even with a fair amount of exertion.  Plan  Risk factor modifications

## 2012-11-25 NOTE — Assessment & Plan Note (Signed)
Last PSA August '13 in normal range. At 72 he is aged out of follow up screening in the absence of symptoms.

## 2012-11-25 NOTE — Assessment & Plan Note (Signed)
Working on diet and taking his medications - very supportive and supervisory family.  Plan A1C with recommendations to follow.  Addendum - A1c 7.8% - much improved.!!  Plan continue present regimen with a little more dietary adherence

## 2012-11-27 ENCOUNTER — Ambulatory Visit: Payer: Medicare Other | Admitting: Cardiovascular Disease

## 2012-12-06 ENCOUNTER — Ambulatory Visit (INDEPENDENT_AMBULATORY_CARE_PROVIDER_SITE_OTHER): Payer: Medicare Other | Admitting: Physician Assistant

## 2012-12-06 ENCOUNTER — Encounter: Payer: Self-pay | Admitting: Physician Assistant

## 2012-12-06 VITALS — BP 130/80 | HR 72 | Ht 74.0 in | Wt 189.0 lb

## 2012-12-06 DIAGNOSIS — R209 Unspecified disturbances of skin sensation: Secondary | ICD-10-CM

## 2012-12-06 DIAGNOSIS — E785 Hyperlipidemia, unspecified: Secondary | ICD-10-CM

## 2012-12-06 DIAGNOSIS — I2510 Atherosclerotic heart disease of native coronary artery without angina pectoris: Secondary | ICD-10-CM

## 2012-12-06 DIAGNOSIS — R200 Anesthesia of skin: Secondary | ICD-10-CM

## 2012-12-06 DIAGNOSIS — I10 Essential (primary) hypertension: Secondary | ICD-10-CM

## 2012-12-06 DIAGNOSIS — I6529 Occlusion and stenosis of unspecified carotid artery: Secondary | ICD-10-CM

## 2012-12-06 NOTE — Patient Instructions (Addendum)
PLEASE SCHEDULE TO HAVE CAROTID DUPLEX DONE IN 02/2013 DX CAROTID STENOSIS  PLEASE FOLLOW UP WITH DR. Clifton James IN 6 MONTHS; WE WILL SEND OUT A REMINDER LETTER

## 2012-12-06 NOTE — Assessment & Plan Note (Signed)
We'll schedule carotid Dopplers for October

## 2012-12-06 NOTE — Assessment & Plan Note (Signed)
Stable without chest pain 

## 2012-12-06 NOTE — Assessment & Plan Note (Signed)
Recent lipid profile excellent

## 2012-12-06 NOTE — Progress Notes (Signed)
HPI:  This is an 79 year old male patient of Dr. Sanjuana Kava who is here for six-month followup. He has a history of nonobstructive coronary artery disease on remote cardiac catheterization, paroxysmal atrial fibrillation changed from Coumadin to Xarelto last office visit, carotid artery disease, mild dementia, hypertension, and diabetes mellitus. Last carotid Dopplers were in October 2013 showing 40-59% bilateral ICA stenosis. He is due for repeat this October.  He recently saw Dr. uncle Lyman Bishop and had complete labs drawn which all looked good. His hemoglobin A1c is still elevated at 7.8 but came down nicely from 9.9. He stopped drinking Coke and sweet tea. He is also working on his diet. He golfs 2-3 days a week. His main trouble is with short-term memory loss. He denies any chest pain, palpitations, dyspnea, dyspnea on exertion, dizziness, edema, or presyncope. Allergies:  -- Sulfa Antibiotics   Current Outpatient Prescriptions on File Prior to Visit: aspirin 81 MG tablet, Take 81 mg by mouth daily.  , Disp: , Rfl:  digoxin (LANOXIN) 0.125 MG tablet, Take 1 tablet (125 mcg total) by mouth daily., Disp: 90 tablet, Rfl: 3 donepezil (ARICEPT) 10 MG tablet, Take 1 tablet (10 mg total) by mouth at bedtime as needed., Disp: 90 tablet, Rfl: 3 metFORMIN (GLUCOPHAGE) 1000 MG tablet, Take 1 tablet (1,000 mg total) by mouth 2 (two) times daily with a meal., Disp: 60 tablet, Rfl: 5 ramipril (ALTACE) 10 MG capsule, Take 1 capsule (10 mg total) by mouth daily., Disp: 90 capsule, Rfl: 3 Rivaroxaban (XARELTO) 20 MG TABS, Take 1 tablet (20 mg total) by mouth daily., Disp: 90 tablet, Rfl: 2 simvastatin (ZOCOR) 20 MG tablet, Take 1 tablet (20 mg total) by mouth at bedtime., Disp: 90 tablet, Rfl: 3  No current facility-administered medications on file prior to visit.   Past Medical History:   Degeneration of intervertebral disc, site unsp*              Occlusion and stenosis of carotid artery witho*               Unspecified essential hypertension                           Inguinal hernia without mention of obstruction*              Memory loss                                                  Peripheral vascular disease, unspecified                     Type II or unspecified type diabetes mellitus *              Coronary atherosclerosis of unspecified type o*              Elevated prostate specific antigen (PSA)                     Atrial fibrillation                                          Other and unspecified hyperlipidemia  Chronic atrial fibrillation                                    Comment:on rate control, medicines and coumadin   Hypertension                                                 Hyperlipemia                                                 Carotid stenosis                                               Comment:status post left carotid endarterectomy in 2008  Past Surgical History:   FLEXIBLE SIGMOIDOSCOPY                           04-28-98     carotid duplex                                   11-15-06       ELECTROCARDIOGRAM                                11-02-06      carotid endartectomy                                            Comment:left   HEMORRHOID SURGERY                                            INGUINAL HERNIA REPAIR                                          Comment:right  Review of patient's family history indicates:   Stroke                         Mother                   Parkinsonism                   Brother                  Social History   Marital Status: Widowed             Spouse Name:                      Years of Education:  Number of children: 2           Occupational History Occupation          Administrator, sports                                          course  Social History Main Topics   Smoking Status: Former Smoker                    Packs/Day: 0.00  Years:         Smokeless Status: Never Used                       Alcohol Use: No             Drug Use: No             Sexual Activity: Yes               Partners with: Male  Other Topics            Concern   None on file  Social History Narrative   Widowed; married in [place]. Enjoys playing lots of golf. Has a grown daughter (1962) who is his primary care-taker and son (1966), several grandchildren. 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 Discussed end of life care: he does not favor CPR or mechanical ventilator support.    ROS: See history of present illness otherwise negative   PHYSICAL EXAM: Well-nournished, in no acute distress. Neck: Bilateral carotid bruits, No JVD, HJR, or thyroid enlargement  Lungs: No tachypnea, clear without wheezing, rales, or rhonchi  Cardiovascular: Irregular irregular, PMI not displaced,no gallops, bruit, thrill, or heave.  Abdomen: BS normal. Soft without organomegaly, masses, lesions or tenderness.  Extremities: without cyanosis, clubbing or edema. Good distal pulses bilateral  SKin: Warm, no lesions or rashes   Musculoskeletal: No deformities  Neuro: no focal signs  BP 130/80  Pulse 72  Ht 6\' 2"  (1.88 m)  Wt 189 lb (85.73 kg)  BMI 24.26 kg/m2

## 2012-12-06 NOTE — Assessment & Plan Note (Signed)
Controlled.  

## 2013-01-18 ENCOUNTER — Encounter: Payer: Self-pay | Admitting: Cardiovascular Disease

## 2013-01-18 ENCOUNTER — Telehealth: Payer: Self-pay | Admitting: *Deleted

## 2013-01-18 MED ORDER — WARFARIN SODIUM 5 MG PO TABS: ORAL_TABLET | ORAL | Status: DC

## 2013-01-18 NOTE — Addendum Note (Signed)
Addended by: Migdalia Dk on: 01/18/2013 03:51 PM   Modules accepted: Orders, Medications

## 2013-01-18 NOTE — Telephone Encounter (Signed)
Spoke with pt's daughter advised pt needs to start taking Coumadin and overlap with Xarelto x 3 days.  Instructed pt's daughter to have pt take 7.5mg  x 2 doses then resume previous dosage of 5mg  daily except 2.5mg  on Fridays.  Will start Coumadin tomorrow 01/19/13, will take Xarelto on 9/5, 9/6, and 9/7 then d/c and continue on Coumadin.  Made pt appt in Coumadin clinic for INR check on 01/26/13 at 7:45am.  Pt's daughter verbalized understanding aware sample bottle of Xarelto left at front desk for pick-up and Coumadin rx has been ordered through [facility] and a local rx sent to [facility] as requested.

## 2013-01-18 NOTE — Telephone Encounter (Signed)
Per my chart message pt would like to switch from Xarelto to Warfarin due to cost. I reviewed with Coumadin clinic and they need to know if pt will need Lovenox bridging or can he do overlap with Xarelto and Warfarin. I reviewed with Dr.McAlhany and overlap would be OK. I spoke with Coumadin clinic and they will contact pt with instructions. He only has one Xarelto tablet left so I will leave samples at front desk for him to pick up.  Xarelto 20 mg, 5 tablets, lot [id number], exp 11/16 left at front desk.

## 2013-01-26 ENCOUNTER — Ambulatory Visit (INDEPENDENT_AMBULATORY_CARE_PROVIDER_SITE_OTHER): Payer: Medicare Other

## 2013-01-26 DIAGNOSIS — Z7901 Long term (current) use of anticoagulants: Secondary | ICD-10-CM | POA: Insufficient documentation

## 2013-01-29 ENCOUNTER — Encounter: Payer: Self-pay | Admitting: Cardiovascular Disease

## 2013-02-23 ENCOUNTER — Ambulatory Visit (INDEPENDENT_AMBULATORY_CARE_PROVIDER_SITE_OTHER): Payer: Medicare Other | Admitting: General Practice

## 2013-02-23 DIAGNOSIS — Z7901 Long term (current) use of anticoagulants: Secondary | ICD-10-CM

## 2013-03-08 ENCOUNTER — Encounter (HOSPITAL_COMMUNITY): Payer: Medicare Other

## 2013-03-20 ENCOUNTER — Encounter: Payer: Self-pay | Admitting: Cardiology

## 2013-03-20 ENCOUNTER — Ambulatory Visit (HOSPITAL_COMMUNITY): Payer: Medicare Other | Attending: Physician Assistant

## 2013-03-20 DIAGNOSIS — I10 Essential (primary) hypertension: Secondary | ICD-10-CM | POA: Insufficient documentation

## 2013-03-20 DIAGNOSIS — I2510 Atherosclerotic heart disease of native coronary artery without angina pectoris: Secondary | ICD-10-CM | POA: Insufficient documentation

## 2013-03-20 DIAGNOSIS — Z87891 Personal history of nicotine dependence: Secondary | ICD-10-CM | POA: Insufficient documentation

## 2013-03-20 DIAGNOSIS — E785 Hyperlipidemia, unspecified: Secondary | ICD-10-CM | POA: Insufficient documentation

## 2013-03-20 DIAGNOSIS — R0989 Other specified symptoms and signs involving the circulatory and respiratory systems: Secondary | ICD-10-CM | POA: Insufficient documentation

## 2013-03-20 DIAGNOSIS — I658 Occlusion and stenosis of other precerebral arteries: Secondary | ICD-10-CM | POA: Insufficient documentation

## 2013-03-20 DIAGNOSIS — E119 Type 2 diabetes mellitus without complications: Secondary | ICD-10-CM | POA: Insufficient documentation

## 2013-03-20 DIAGNOSIS — I6529 Occlusion and stenosis of unspecified carotid artery: Secondary | ICD-10-CM

## 2013-03-22 ENCOUNTER — Other Ambulatory Visit: Payer: Self-pay

## 2013-03-23 ENCOUNTER — Ambulatory Visit (INDEPENDENT_AMBULATORY_CARE_PROVIDER_SITE_OTHER): Payer: Medicare Other | Admitting: *Deleted

## 2013-03-23 DIAGNOSIS — Z7901 Long term (current) use of anticoagulants: Secondary | ICD-10-CM

## 2013-03-23 LAB — POCT INR: INR: 1.8

## 2013-03-28 ENCOUNTER — Telehealth: Payer: Self-pay | Admitting: *Deleted

## 2013-03-28 NOTE — Telephone Encounter (Signed)
lmtcb on home number, number provided

## 2013-03-29 ENCOUNTER — Telehealth: Payer: Self-pay | Admitting: *Deleted

## 2013-03-29 ENCOUNTER — Telehealth: Payer: Self-pay | Admitting: Cardiovascular Disease

## 2013-03-29 NOTE — Telephone Encounter (Signed)
Follow Up:  Pt states she is returning Octavia's call.

## 2013-03-29 NOTE — Telephone Encounter (Signed)
lmtcb number provided 

## 2013-04-13 ENCOUNTER — Ambulatory Visit (INDEPENDENT_AMBULATORY_CARE_PROVIDER_SITE_OTHER): Payer: Medicare Other | Admitting: *Deleted

## 2013-04-13 DIAGNOSIS — Z23 Encounter for immunization: Secondary | ICD-10-CM

## 2013-04-13 DIAGNOSIS — Z7901 Long term (current) use of anticoagulants: Secondary | ICD-10-CM

## 2013-04-18 ENCOUNTER — Other Ambulatory Visit: Payer: Self-pay | Admitting: Pharmacist

## 2013-04-18 MED ORDER — WARFARIN SODIUM 5 MG PO TABS: ORAL_TABLET | ORAL | Status: DC

## 2013-04-23 ENCOUNTER — Other Ambulatory Visit: Payer: Self-pay

## 2013-04-23 MED ORDER — WARFARIN SODIUM 5 MG PO TABS: ORAL_TABLET | ORAL | Status: DC

## 2013-05-11 ENCOUNTER — Ambulatory Visit (INDEPENDENT_AMBULATORY_CARE_PROVIDER_SITE_OTHER): Payer: Medicare Other | Admitting: *Deleted

## 2013-05-11 DIAGNOSIS — Z7901 Long term (current) use of anticoagulants: Secondary | ICD-10-CM

## 2013-05-11 LAB — POCT INR: INR: 2.1

## 2013-05-11 MED ORDER — WARFARIN SODIUM 5 MG PO TABS: ORAL_TABLET | ORAL | Status: DC

## 2013-05-21 ENCOUNTER — Other Ambulatory Visit: Payer: Self-pay

## 2013-05-21 MED ORDER — RAMIPRIL 10 MG PO CAPS: 10.0000 mg | ORAL_CAPSULE | Freq: Every day | ORAL | Status: DC

## 2013-05-25 ENCOUNTER — Other Ambulatory Visit: Payer: Self-pay

## 2013-05-25 MED ORDER — DONEPEZIL HCL 10 MG PO TABS
10.0000 mg | ORAL_TABLET | Freq: Every evening | ORAL | Status: DC | PRN
Start: 2013-05-25 — End: 2014-05-16

## 2013-05-25 MED ORDER — SIMVASTATIN 20 MG PO TABS: 20.0000 mg | ORAL_TABLET | Freq: Every day | ORAL | Status: DC

## 2013-05-25 MED ORDER — METFORMIN HCL 1000 MG PO TABS: 1000.0000 mg | ORAL_TABLET | Freq: Two times a day (BID) | ORAL | Status: DC

## 2013-06-08 ENCOUNTER — Ambulatory Visit (INDEPENDENT_AMBULATORY_CARE_PROVIDER_SITE_OTHER): Payer: Medicare Other | Admitting: *Deleted

## 2013-06-08 DIAGNOSIS — Z5181 Encounter for therapeutic drug level monitoring: Secondary | ICD-10-CM | POA: Insufficient documentation

## 2013-06-08 DIAGNOSIS — Z7901 Long term (current) use of anticoagulants: Secondary | ICD-10-CM

## 2013-06-08 LAB — POCT INR: INR: 2

## 2013-07-06 ENCOUNTER — Ambulatory Visit (INDEPENDENT_AMBULATORY_CARE_PROVIDER_SITE_OTHER): Payer: Medicare Other | Admitting: *Deleted

## 2013-07-06 DIAGNOSIS — Z5181 Encounter for therapeutic drug level monitoring: Secondary | ICD-10-CM

## 2013-07-06 DIAGNOSIS — Z7901 Long term (current) use of anticoagulants: Secondary | ICD-10-CM

## 2013-07-06 LAB — POCT INR: INR: 1.8

## 2013-07-20 ENCOUNTER — Ambulatory Visit (INDEPENDENT_AMBULATORY_CARE_PROVIDER_SITE_OTHER): Payer: Medicare Other

## 2013-07-20 DIAGNOSIS — Z7901 Long term (current) use of anticoagulants: Secondary | ICD-10-CM

## 2013-07-20 DIAGNOSIS — Z5181 Encounter for therapeutic drug level monitoring: Secondary | ICD-10-CM

## 2013-07-20 LAB — POCT INR: INR: 2.7

## 2013-07-27 ENCOUNTER — Encounter: Payer: Self-pay | Admitting: Cardiovascular Disease

## 2013-08-17 ENCOUNTER — Ambulatory Visit (INDEPENDENT_AMBULATORY_CARE_PROVIDER_SITE_OTHER): Payer: Medicare Other | Admitting: *Deleted

## 2013-08-17 DIAGNOSIS — Z5181 Encounter for therapeutic drug level monitoring: Secondary | ICD-10-CM

## 2013-08-17 DIAGNOSIS — Z7901 Long term (current) use of anticoagulants: Secondary | ICD-10-CM

## 2013-08-17 LAB — POCT INR: INR: 2.3

## 2013-08-30 ENCOUNTER — Telehealth: Payer: Self-pay | Admitting: *Deleted

## 2013-08-30 DIAGNOSIS — I4891 Unspecified atrial fibrillation: Secondary | ICD-10-CM

## 2013-08-30 MED ORDER — DIGOXIN 125 MCG PO TABS: 125.0000 ug | ORAL_TABLET | Freq: Every day | ORAL | Status: DC

## 2013-08-30 NOTE — Telephone Encounter (Signed)
Pt's daughter called requesting a refill for pt sent to [facility] on [address].  Refill done.

## 2013-09-14 ENCOUNTER — Ambulatory Visit (INDEPENDENT_AMBULATORY_CARE_PROVIDER_SITE_OTHER): Payer: Medicare Other | Admitting: *Deleted

## 2013-09-14 DIAGNOSIS — Z5181 Encounter for therapeutic drug level monitoring: Secondary | ICD-10-CM

## 2013-09-14 DIAGNOSIS — Z7901 Long term (current) use of anticoagulants: Secondary | ICD-10-CM

## 2013-09-14 LAB — POCT INR: INR: 3.2

## 2013-10-05 ENCOUNTER — Ambulatory Visit (INDEPENDENT_AMBULATORY_CARE_PROVIDER_SITE_OTHER): Payer: Medicare Other

## 2013-10-05 DIAGNOSIS — Z7901 Long term (current) use of anticoagulants: Secondary | ICD-10-CM

## 2013-10-05 DIAGNOSIS — Z5181 Encounter for therapeutic drug level monitoring: Secondary | ICD-10-CM

## 2013-10-05 LAB — POCT INR: INR: 2.8

## 2013-10-09 ENCOUNTER — Other Ambulatory Visit: Payer: Self-pay

## 2013-10-09 DIAGNOSIS — I4891 Unspecified atrial fibrillation: Secondary | ICD-10-CM

## 2013-10-09 MED ORDER — DIGOXIN 125 MCG PO TABS: 125.0000 ug | ORAL_TABLET | Freq: Every day | ORAL | Status: DC

## 2013-11-02 ENCOUNTER — Ambulatory Visit (INDEPENDENT_AMBULATORY_CARE_PROVIDER_SITE_OTHER): Payer: Medicare Other | Admitting: *Deleted

## 2013-11-02 DIAGNOSIS — Z5181 Encounter for therapeutic drug level monitoring: Secondary | ICD-10-CM

## 2013-11-02 DIAGNOSIS — Z7901 Long term (current) use of anticoagulants: Secondary | ICD-10-CM

## 2013-11-02 LAB — POCT INR: INR: 2.1

## 2013-11-30 ENCOUNTER — Ambulatory Visit (INDEPENDENT_AMBULATORY_CARE_PROVIDER_SITE_OTHER): Payer: Medicare Other | Admitting: *Deleted

## 2013-11-30 DIAGNOSIS — Z7901 Long term (current) use of anticoagulants: Secondary | ICD-10-CM

## 2013-11-30 DIAGNOSIS — Z5181 Encounter for therapeutic drug level monitoring: Secondary | ICD-10-CM

## 2013-11-30 LAB — POCT INR: INR: 3

## 2014-01-07 ENCOUNTER — Ambulatory Visit (INDEPENDENT_AMBULATORY_CARE_PROVIDER_SITE_OTHER): Payer: Medicare Other | Admitting: *Deleted

## 2014-01-07 DIAGNOSIS — Z5181 Encounter for therapeutic drug level monitoring: Secondary | ICD-10-CM

## 2014-01-07 DIAGNOSIS — Z7901 Long term (current) use of anticoagulants: Secondary | ICD-10-CM

## 2014-01-07 LAB — POCT INR: INR: 3.1

## 2014-01-07 NOTE — Patient Instructions (Signed)
Last reading was 3.0 and today INR was 3.1. So have Juan Carroll eat another  serving of a leafy green vegetable each week. # Coumadin Clinic [phone].

## 2014-01-09 ENCOUNTER — Other Ambulatory Visit: Payer: Self-pay | Admitting: *Deleted

## 2014-01-09 DIAGNOSIS — I4891 Unspecified atrial fibrillation: Secondary | ICD-10-CM

## 2014-01-09 MED ORDER — DIGOXIN 125 MCG PO TABS: 125.0000 ug | ORAL_TABLET | Freq: Every day | ORAL | Status: DC

## 2014-02-04 ENCOUNTER — Ambulatory Visit (INDEPENDENT_AMBULATORY_CARE_PROVIDER_SITE_OTHER): Payer: Medicare Other | Admitting: Pharmacist

## 2014-02-04 DIAGNOSIS — Z5181 Encounter for therapeutic drug level monitoring: Secondary | ICD-10-CM

## 2014-02-04 DIAGNOSIS — Z7901 Long term (current) use of anticoagulants: Secondary | ICD-10-CM

## 2014-02-04 LAB — POCT INR: INR: 2.4

## 2014-02-09 ENCOUNTER — Other Ambulatory Visit: Payer: Self-pay | Admitting: Internal Medicine

## 2014-02-19 ENCOUNTER — Encounter: Payer: Self-pay | Admitting: Internal Medicine

## 2014-02-19 ENCOUNTER — Ambulatory Visit (INDEPENDENT_AMBULATORY_CARE_PROVIDER_SITE_OTHER): Payer: Medicare Other | Admitting: Internal Medicine

## 2014-02-19 ENCOUNTER — Other Ambulatory Visit (INDEPENDENT_AMBULATORY_CARE_PROVIDER_SITE_OTHER): Payer: Medicare Other

## 2014-02-19 VITALS — BP 130/64 | HR 93 | Temp 97.3°F | Resp 16 | Ht 74.0 in | Wt 190.1 lb

## 2014-02-19 DIAGNOSIS — F0280 Dementia in other diseases classified elsewhere without behavioral disturbance: Secondary | ICD-10-CM

## 2014-02-19 DIAGNOSIS — E119 Type 2 diabetes mellitus without complications: Secondary | ICD-10-CM

## 2014-02-19 DIAGNOSIS — Z0000 Encounter for general adult medical examination without abnormal findings: Secondary | ICD-10-CM

## 2014-02-19 DIAGNOSIS — I482 Chronic atrial fibrillation, unspecified: Secondary | ICD-10-CM

## 2014-02-19 DIAGNOSIS — I4891 Unspecified atrial fibrillation: Secondary | ICD-10-CM | POA: Insufficient documentation

## 2014-02-19 DIAGNOSIS — E785 Hyperlipidemia, unspecified: Secondary | ICD-10-CM

## 2014-02-19 DIAGNOSIS — I2510 Atherosclerotic heart disease of native coronary artery without angina pectoris: Secondary | ICD-10-CM

## 2014-02-19 DIAGNOSIS — G309 Alzheimer's disease, unspecified: Secondary | ICD-10-CM

## 2014-02-19 DIAGNOSIS — R300 Dysuria: Secondary | ICD-10-CM

## 2014-02-19 DIAGNOSIS — I10 Essential (primary) hypertension: Secondary | ICD-10-CM

## 2014-02-19 LAB — POCT URINALYSIS DIPSTICK
Blood, UA: NEGATIVE
Glucose, UA: NEGATIVE
Ketones, UA: NEGATIVE
LEUKOCYTES UA: NEGATIVE
Nitrite, UA: NEGATIVE
Spec Grav, UA: 1.02
Urobilinogen, UA: 0.2
pH, UA: 6

## 2014-02-19 LAB — BASIC METABOLIC PANEL
BUN: 23 mg/dL (ref 6–23)
CALCIUM: 9.8 mg/dL (ref 8.4–10.5)
CHLORIDE: 96 meq/L (ref 96–112)
CO2: 28 mEq/L (ref 19–32)
CREATININE: 1.2 mg/dL (ref 0.4–1.5)
GFR: 61.89 mL/min (ref >60.00)
Glucose, Bld: 198 mg/dL — ABNORMAL HIGH (ref 70–99)
Potassium: 4.7 mEq/L (ref 3.5–5.1)
Sodium: 134 mEq/L — ABNORMAL LOW (ref 135–145)

## 2014-02-19 LAB — HEMOGLOBIN A1C: HEMOGLOBIN A1C: 7.4 % — AB (ref 4.6–6.5)

## 2014-02-19 MED ORDER — METOPROLOL SUCCINATE ER 25 MG PO TB24: 25.0000 mg | ORAL_TABLET | Freq: Every day | ORAL | Status: DC

## 2014-02-19 NOTE — Assessment & Plan Note (Signed)
BP well controlled. Check BMP today.

## 2014-02-19 NOTE — Assessment & Plan Note (Signed)
He is continuing to take metformin. He is on ACE-I as well. He will go see the eye doctor sometime soon. Would accept goal HgA1c <8. Checking today. Foot exam done today.

## 2014-02-19 NOTE — Progress Notes (Signed)
   Subjective:    Patient ID: Juan Carroll, male    DOB: 04/12/1928, 80 y.o.   MRN: [id number]  HPI The patient is a 80 YO man who is coming in to establish care. He has PMH significant for atrial fibrillation, diabetes type 2, CAD (hx CEA), alzheimer's dementia, hyperlipidemia. His daughter is present and helps to provide most of the history. He denies any pain. She states that his memory has declined to the point of wandering, eating all the time, impatience. He does still recognize his daughter and her husband but short term and long term memory are very poor. He is able to follow conversations for short time but is not able to always answer questions reasonably. He does comment about how many golf clubs we have at the office (there are none here). He still is able to golf if his daughter takes him and helps to direct him as his attention span is limited. If he eats too much (he never remembers that he has just eaten) he sometimes gets sick on his stomach from too much food.   Review of Systems  Constitutional: Positive for activity change. Negative for fever, appetite change and fatigue.  HENT: Negative.   Respiratory: Negative for cough, chest tightness, shortness of breath and wheezing.   Cardiovascular: Negative for chest pain, palpitations and leg swelling.  Gastrointestinal: Negative for abdominal pain, diarrhea, constipation and abdominal distention.  Genitourinary: Negative.   Musculoskeletal: Negative for gait problem.  Skin: Negative.   Neurological: Negative for dizziness, speech difficulty, light-headedness and headaches.  Psychiatric/Behavioral: Positive for behavioral problems, sleep disturbance and decreased concentration.      Objective:   Physical Exam  Constitutional: He appears well-developed and well-nourished. No distress.  HENT:  Head: Normocephalic and atraumatic.  Eyes: EOM are normal.  Neck: Normal range of motion.  Cardiovascular: Normal rate.   irreg irreg    Pulmonary/Chest: Effort normal and breath sounds normal. No respiratory distress. He has no wheezes. He has no rales.  Abdominal: Soft. Bowel sounds are normal. He exhibits no distension. There is no tenderness.  Neurological: He is alert.  Oriented to person only  Skin: Skin is warm and dry.   Filed Vitals:   02/19/14 1020  BP: 130/64  Pulse: 93  Temp: 97.3 F (36.3 C)  TempSrc: Oral  Resp: 16  Height: 6\' 2"  (1.88 m)  Weight: 190 lb 1.9 oz (86.238 kg)  SpO2: 96%      Assessment & Plan:

## 2014-02-19 NOTE — Patient Instructions (Addendum)
We have given you the flu shot and are going to simplify his medical regimen.   We will have you stop taking the digimex (also called digoxin). We will also have you stop taking simvastatin (also called zocor). Stop taking the aspirin. Continue with the coumadin.  We will start a medicine called metoprolol (also called toprol-xl). This will help his heart from going too fast if he has problems with atrial fibrillation. This is a much safer medicine than the digoxin.   We will check on his blood work today and let you know about the results.  Come back in about a year unless you have problems or are sick before then.

## 2014-02-19 NOTE — Progress Notes (Signed)
Pre visit review using our clinic review tool, if applicable. No additional management support is needed unless otherwise documented below in the visit note. 

## 2014-02-19 NOTE — Assessment & Plan Note (Signed)
Given advanced dementia and not pursing aggressive interventions will stop statin as he will not likely live long enough for the benefit and decreasing medical regimen.

## 2014-02-19 NOTE — Assessment & Plan Note (Signed)
Flu shot given today. No further intervention with screening. He does have history of elevated PSA but at this time would not be a candidate for intervention and will not recheck in the future.

## 2014-02-19 NOTE — Assessment & Plan Note (Signed)
Patient on aricept for years and starting to have some decline in the last year. He is unable to leave the home unassisted. He does tend to wander and has left the home in the early morning. His medications are managed by his daughter and he lives with his daughter and her husband. The daughter would like to simplify his medical regimen and does not wish to pursue aggressive treatments or interventions.

## 2014-02-19 NOTE — Assessment & Plan Note (Signed)
He does follow with cardiology. Is taking ACE-I, beta blocker.

## 2014-02-19 NOTE — Assessment & Plan Note (Signed)
Is not regular on exam today. He is anticoagulated with warfarin. Will stop digoxin and start low dose beta blocker. Advised he can stop taking aspirin with coumadin as no hx stroke or recent coronary intervention.

## 2014-03-04 ENCOUNTER — Ambulatory Visit (INDEPENDENT_AMBULATORY_CARE_PROVIDER_SITE_OTHER): Payer: Medicare Other | Admitting: *Deleted

## 2014-03-04 DIAGNOSIS — Z7901 Long term (current) use of anticoagulants: Secondary | ICD-10-CM

## 2014-03-04 LAB — POCT INR: INR: 2.4

## 2014-03-22 ENCOUNTER — Encounter: Payer: Self-pay | Admitting: Internal Medicine

## 2014-03-26 ENCOUNTER — Other Ambulatory Visit (HOSPITAL_COMMUNITY): Payer: Self-pay | Admitting: Cardiology

## 2014-03-26 DIAGNOSIS — I6523 Occlusion and stenosis of bilateral carotid arteries: Secondary | ICD-10-CM

## 2014-04-01 ENCOUNTER — Ambulatory Visit (HOSPITAL_COMMUNITY): Payer: Medicare Other | Attending: Internal Medicine | Admitting: Cardiology

## 2014-04-01 ENCOUNTER — Ambulatory Visit (INDEPENDENT_AMBULATORY_CARE_PROVIDER_SITE_OTHER): Payer: Medicare Other | Admitting: *Deleted

## 2014-04-01 DIAGNOSIS — I10 Essential (primary) hypertension: Secondary | ICD-10-CM | POA: Diagnosis not present

## 2014-04-01 DIAGNOSIS — E785 Hyperlipidemia, unspecified: Secondary | ICD-10-CM | POA: Insufficient documentation

## 2014-04-01 DIAGNOSIS — Z7901 Long term (current) use of anticoagulants: Secondary | ICD-10-CM

## 2014-04-01 DIAGNOSIS — E119 Type 2 diabetes mellitus without complications: Secondary | ICD-10-CM | POA: Diagnosis not present

## 2014-04-01 DIAGNOSIS — I6523 Occlusion and stenosis of bilateral carotid arteries: Secondary | ICD-10-CM

## 2014-04-01 DIAGNOSIS — Z87891 Personal history of nicotine dependence: Secondary | ICD-10-CM | POA: Insufficient documentation

## 2014-04-01 DIAGNOSIS — I2510 Atherosclerotic heart disease of native coronary artery without angina pectoris: Secondary | ICD-10-CM | POA: Diagnosis not present

## 2014-04-01 LAB — POCT INR: INR: 1.8

## 2014-04-01 NOTE — Progress Notes (Signed)
Carotid duplex performed 

## 2014-04-17 ENCOUNTER — Other Ambulatory Visit: Payer: Medicare Other

## 2014-04-22 ENCOUNTER — Ambulatory Visit (INDEPENDENT_AMBULATORY_CARE_PROVIDER_SITE_OTHER): Payer: Medicare Other

## 2014-04-22 DIAGNOSIS — Z7901 Long term (current) use of anticoagulants: Secondary | ICD-10-CM

## 2014-04-22 LAB — POCT INR: INR: 2

## 2014-05-15 ENCOUNTER — Other Ambulatory Visit: Payer: Self-pay | Admitting: Surgery

## 2014-05-15 MED ORDER — WARFARIN SODIUM 5 MG PO TABS: ORAL_TABLET | ORAL | Status: DC

## 2014-05-16 ENCOUNTER — Other Ambulatory Visit: Payer: Self-pay | Admitting: Geriatric Medicine

## 2014-05-16 MED ORDER — RAMIPRIL 10 MG PO CAPS: 10.0000 mg | ORAL_CAPSULE | Freq: Every day | ORAL | Status: DC

## 2014-05-16 MED ORDER — METFORMIN HCL 1000 MG PO TABS: 1000.0000 mg | ORAL_TABLET | Freq: Two times a day (BID) | ORAL | Status: DC

## 2014-05-16 MED ORDER — DONEPEZIL HCL 10 MG PO TABS: 10.0000 mg | ORAL_TABLET | Freq: Every evening | ORAL | Status: DC | PRN

## 2014-05-20 ENCOUNTER — Ambulatory Visit (INDEPENDENT_AMBULATORY_CARE_PROVIDER_SITE_OTHER): Payer: Medicare Other | Admitting: Pharmacist

## 2014-05-20 ENCOUNTER — Telehealth: Payer: Self-pay | Admitting: Internal Medicine

## 2014-05-20 ENCOUNTER — Other Ambulatory Visit: Payer: Self-pay | Admitting: Geriatric Medicine

## 2014-05-20 DIAGNOSIS — Z7901 Long term (current) use of anticoagulants: Secondary | ICD-10-CM

## 2014-05-20 LAB — POCT INR: INR: 2.1

## 2014-05-20 MED ORDER — DONEPEZIL HCL 10 MG PO TABS: 10.0000 mg | ORAL_TABLET | Freq: Every evening | ORAL | Status: DC | PRN

## 2014-05-20 MED ORDER — RAMIPRIL 10 MG PO CAPS: 10.0000 mg | ORAL_CAPSULE | Freq: Every day | ORAL | Status: DC

## 2014-05-20 MED ORDER — WARFARIN SODIUM 5 MG PO TABS: ORAL_TABLET | ORAL | Status: DC

## 2014-05-20 MED ORDER — METOPROLOL SUCCINATE ER 25 MG PO TB24: 25.0000 mg | ORAL_TABLET | Freq: Every day | ORAL | Status: DC

## 2014-05-20 MED ORDER — METFORMIN HCL 1000 MG PO TABS: 1000.0000 mg | ORAL_TABLET | Freq: Two times a day (BID) | ORAL | Status: AC

## 2014-05-20 NOTE — Telephone Encounter (Signed)
States mail order pharmacy has messed script shipments up.  Daughter is requesting 10 day supply of meds to be sent to [facility] on [address].  Patient is currently out of ramipril.

## 2014-05-20 NOTE — Telephone Encounter (Signed)
Sent 10 day supply of all medications to [facility] on [address]

## 2014-05-22 ENCOUNTER — Other Ambulatory Visit: Payer: Self-pay | Admitting: Geriatric Medicine

## 2014-05-31 ENCOUNTER — Telehealth: Payer: Self-pay | Admitting: Internal Medicine

## 2014-05-31 NOTE — Telephone Encounter (Signed)
Pt needing Ramipril called in a couple pills until his mail order arrives, daughter afraid due to 1/18 being a holiday pt will not get until Tuesday next wk and he will be out. [facility]

## 2014-06-02 ENCOUNTER — Other Ambulatory Visit: Payer: Self-pay | Admitting: Internal Medicine

## 2014-06-03 ENCOUNTER — Other Ambulatory Visit: Payer: Self-pay | Admitting: Geriatric Medicine

## 2014-06-03 MED ORDER — RAMIPRIL 10 MG PO CAPS: 10.0000 mg | ORAL_CAPSULE | Freq: Every day | ORAL | Status: DC

## 2014-06-03 NOTE — Telephone Encounter (Signed)
Sent to pharmacy 

## 2014-06-05 ENCOUNTER — Other Ambulatory Visit: Payer: Self-pay | Admitting: Geriatric Medicine

## 2014-06-05 MED ORDER — METOPROLOL SUCCINATE ER 25 MG PO TB24
25.0000 mg | ORAL_TABLET | Freq: Every day | ORAL | Status: DC
Start: 2014-06-05 — End: 2014-06-28

## 2014-06-12 NOTE — Telephone Encounter (Signed)
Patient daughter states that pharmacy did not receive this rx refill. Please resend.

## 2014-06-13 MED ORDER — DONEPEZIL HCL 10 MG PO TABS: 10.0000 mg | ORAL_TABLET | Freq: Every evening | ORAL | Status: AC | PRN

## 2014-06-14 ENCOUNTER — Telehealth: Payer: Self-pay | Admitting: *Deleted

## 2014-06-14 NOTE — Telephone Encounter (Signed)
LVM to see if pt already had Flu shot done or need an appointment to get one. 

## 2014-06-19 ENCOUNTER — Other Ambulatory Visit: Payer: Self-pay | Admitting: Internal Medicine

## 2014-06-24 ENCOUNTER — Ambulatory Visit (INDEPENDENT_AMBULATORY_CARE_PROVIDER_SITE_OTHER): Payer: Medicare Other | Admitting: Pharmacist

## 2014-06-24 DIAGNOSIS — Z7901 Long term (current) use of anticoagulants: Secondary | ICD-10-CM

## 2014-06-24 LAB — POCT INR: INR: 1.7

## 2014-06-27 ENCOUNTER — Telehealth: Payer: Self-pay | Admitting: Internal Medicine

## 2014-06-27 NOTE — Telephone Encounter (Signed)
Requesting full RX of metoprolol succinate (TOPROL-XL) 25 MG 24 hr tablet [[id number]][[id number]]  Instead of the 10 pill RX. This is not one of the meds he is receiving through the mail.  [facility] to [facility] [address]

## 2014-06-28 ENCOUNTER — Other Ambulatory Visit: Payer: Self-pay | Admitting: Geriatric Medicine

## 2014-06-28 MED ORDER — METOPROLOL SUCCINATE ER 25 MG PO TB24: 25.0000 mg | ORAL_TABLET | Freq: Every day | ORAL | Status: DC

## 2014-06-28 NOTE — Telephone Encounter (Signed)
Sent to pharmacy 

## 2014-07-02 ENCOUNTER — Encounter: Payer: Self-pay | Admitting: Internal Medicine

## 2014-07-02 ENCOUNTER — Telehealth: Payer: Self-pay | Admitting: Internal Medicine

## 2014-07-02 DIAGNOSIS — E119 Type 2 diabetes mellitus without complications: Secondary | ICD-10-CM

## 2014-07-02 NOTE — Telephone Encounter (Signed)
Please advise, thanks.

## 2014-07-02 NOTE — Telephone Encounter (Signed)
Pt send this request through my chart. Please advise.   Dr. Dorise HissKollar:     I would like to get a LAB workup for dad; I don't need him to come in unless you think he needs to. He was having problems with his prostate (elevated PSA); he had this done at the Urgent Care and I would like to get a re-check on his labs to get everything checked out again. I would like to check his sugar as well - can you schedule him at the lab to get this done?    Thank you for all your help.

## 2014-07-03 NOTE — Addendum Note (Signed)
Addended by: Genella MechKOLLAR, Master Touchet A on: 07/03/2014 11:15 AM   Modules accepted: Orders

## 2014-07-08 ENCOUNTER — Other Ambulatory Visit (INDEPENDENT_AMBULATORY_CARE_PROVIDER_SITE_OTHER): Payer: Medicare Other

## 2014-07-08 DIAGNOSIS — E119 Type 2 diabetes mellitus without complications: Secondary | ICD-10-CM

## 2014-07-08 LAB — HEMOGLOBIN A1C: HEMOGLOBIN A1C: 6.7 % — AB (ref 4.6–6.5)

## 2014-07-08 LAB — PSA: PSA: 4.13 ng/mL — ABNORMAL HIGH (ref 0.10–4.00)

## 2014-07-15 ENCOUNTER — Ambulatory Visit (INDEPENDENT_AMBULATORY_CARE_PROVIDER_SITE_OTHER): Payer: Medicare Other | Admitting: Pharmacist

## 2014-07-15 DIAGNOSIS — Z7901 Long term (current) use of anticoagulants: Secondary | ICD-10-CM

## 2014-07-15 LAB — POCT INR: INR: 1.7

## 2014-07-29 ENCOUNTER — Ambulatory Visit (INDEPENDENT_AMBULATORY_CARE_PROVIDER_SITE_OTHER): Payer: Medicare Other | Admitting: *Deleted

## 2014-07-29 DIAGNOSIS — Z7901 Long term (current) use of anticoagulants: Secondary | ICD-10-CM

## 2014-07-29 LAB — POCT INR: INR: 1.9

## 2014-08-01 ENCOUNTER — Telehealth: Payer: Self-pay | Admitting: Internal Medicine

## 2014-08-02 ENCOUNTER — Telehealth: Payer: Self-pay

## 2014-08-02 NOTE — Telephone Encounter (Signed)
Advised pt to call back to schedule if he still wants flu vaccine 

## 2014-08-12 ENCOUNTER — Ambulatory Visit (INDEPENDENT_AMBULATORY_CARE_PROVIDER_SITE_OTHER): Payer: Medicare Other | Admitting: *Deleted

## 2014-08-12 DIAGNOSIS — Z7901 Long term (current) use of anticoagulants: Secondary | ICD-10-CM

## 2014-08-12 LAB — POCT INR: INR: 2.4

## 2014-08-14 ENCOUNTER — Encounter: Payer: Self-pay | Admitting: Internal Medicine

## 2014-09-02 ENCOUNTER — Ambulatory Visit (INDEPENDENT_AMBULATORY_CARE_PROVIDER_SITE_OTHER): Payer: Medicare Other | Admitting: *Deleted

## 2014-09-02 DIAGNOSIS — Z7901 Long term (current) use of anticoagulants: Secondary | ICD-10-CM

## 2014-09-02 LAB — POCT INR: INR: 2.7

## 2014-09-30 ENCOUNTER — Ambulatory Visit (INDEPENDENT_AMBULATORY_CARE_PROVIDER_SITE_OTHER): Payer: Medicare Other | Admitting: *Deleted

## 2014-09-30 DIAGNOSIS — Z7901 Long term (current) use of anticoagulants: Secondary | ICD-10-CM

## 2014-09-30 LAB — POCT INR: INR: 2.9

## 2014-11-11 ENCOUNTER — Ambulatory Visit (INDEPENDENT_AMBULATORY_CARE_PROVIDER_SITE_OTHER): Payer: Medicare Other

## 2014-11-11 DIAGNOSIS — Z7901 Long term (current) use of anticoagulants: Secondary | ICD-10-CM

## 2014-11-11 LAB — POCT INR: INR: 2.4

## 2014-12-12 ENCOUNTER — Telehealth: Payer: Self-pay | Admitting: *Deleted

## 2014-12-12 NOTE — Telephone Encounter (Signed)
"  My dad will be a new patient.  I think he has an ingrown toenail.  He's a coumadin patient.  I want to know if I should just bring him in for a consultation so we can look at it or should I go ahead and get him off the Coumadin for about 5 days before I bring him in.  I don't know how that works.  Please give me a call."

## 2014-12-13 NOTE — Telephone Encounter (Signed)
I called and left her a message to have her father come in to be evaluated.  May be able to do a normal toenail trim and solve the problem.  If something needs to be done further, the doctor will tell you how to proceed.  Call if you have any further questions.

## 2014-12-16 ENCOUNTER — Telehealth: Payer: Self-pay | Admitting: *Deleted

## 2014-12-16 NOTE — Telephone Encounter (Signed)
Left message on pt's dtr's phone, Ms. Pablo Lawrence asked multiple question through Email on 12/16/2014 concerning accompanying her ftr, and his Coumadin and an ingrown toenail procedure.  I left message telling her she could accompany her ftr and often and ingrown toenail procedure would not need her ftr to be off the Coumadin, and I encouraged her to call with concerns.

## 2014-12-23 ENCOUNTER — Ambulatory Visit (INDEPENDENT_AMBULATORY_CARE_PROVIDER_SITE_OTHER): Payer: Medicare Other | Admitting: Pharmacist

## 2014-12-23 DIAGNOSIS — Z7901 Long term (current) use of anticoagulants: Secondary | ICD-10-CM

## 2014-12-23 LAB — POCT INR: INR: 3.6

## 2014-12-26 ENCOUNTER — Encounter: Payer: Self-pay | Admitting: Podiatry

## 2014-12-26 ENCOUNTER — Ambulatory Visit (INDEPENDENT_AMBULATORY_CARE_PROVIDER_SITE_OTHER): Payer: Medicare Other | Admitting: Podiatry

## 2014-12-26 VITALS — BP 137/88 | HR 77 | Resp 16

## 2014-12-26 DIAGNOSIS — B351 Tinea unguium: Secondary | ICD-10-CM | POA: Diagnosis not present

## 2014-12-26 DIAGNOSIS — M79673 Pain in unspecified foot: Secondary | ICD-10-CM

## 2014-12-26 NOTE — Progress Notes (Signed)
   Subjective:    Patient ID: Juan Carroll, male    DOB: 1927-10-20, 81 y.o.   MRN: [id number]  HPI  Chief Complaint  Patient presents with  . Toe Pain    2nd toe right - thick, discolored nail x several months - tender - no treatment - patient has alzheimers   he states that all of his toenails bother him particularly the second digit the right foot. His daughter presents with him today stating that he complains of all of his toenails bother him on and off. States that she is unable to trim his nails herself.    Review of Systems  HENT: Positive for hearing loss.   Hematological: Bruises/bleeds easily.  Psychiatric/Behavioral: Positive for confusion.  All other systems reviewed and are negative.      Objective:   Physical Exam: 81 year old white male with a history of Alzheimer's ambulating today vital signs are stable he is alert. Pulses are strongly palpable bilateral. Neurologic sensorium is intact per Semmes-Weinstein monofilament. Deep tendon reflexes are intact bilateral. Muscle strength +5 over 5 dorsiflexion plantar flexors and inverters everters on physical musculatures intact. Orthopedic evaluation demonstrates all joints distal to the ankle for range of motion without crepitation. Cutaneous evaluation demonstrates supple well-hydrated cutis nails are thick yellow dystrophic with mycotic sharp incurvated nail margins 1 through 5 bilaterally the second digit does not demonstrate any type of bacterial infection.        Assessment & Plan:  Assessment: Pain in limb secondary to onychomycosis 1 through 5 bilateral.  Plan: Debridement of his toenails 1 through 5 bilateral as a covered service secondary to pain.

## 2015-01-09 ENCOUNTER — Other Ambulatory Visit (INDEPENDENT_AMBULATORY_CARE_PROVIDER_SITE_OTHER): Payer: Medicare Other

## 2015-01-09 ENCOUNTER — Ambulatory Visit (INDEPENDENT_AMBULATORY_CARE_PROVIDER_SITE_OTHER): Payer: Medicare Other | Admitting: Family

## 2015-01-09 ENCOUNTER — Encounter: Payer: Self-pay | Admitting: Family

## 2015-01-09 VITALS — BP 142/88 | HR 59 | Temp 98.1°F | Resp 16 | Ht 74.0 in | Wt 207.0 lb

## 2015-01-09 DIAGNOSIS — Z23 Encounter for immunization: Secondary | ICD-10-CM

## 2015-01-09 DIAGNOSIS — Z0000 Encounter for general adult medical examination without abnormal findings: Secondary | ICD-10-CM

## 2015-01-09 LAB — COMPREHENSIVE METABOLIC PANEL
ALBUMIN: 4.3 g/dL (ref 3.5–5.2)
ALT: 19 U/L (ref 0–53)
AST: 27 U/L (ref 0–37)
Alkaline Phosphatase: 156 U/L — ABNORMAL HIGH (ref 39–117)
BUN: 23 mg/dL (ref 6–23)
CHLORIDE: 98 meq/L (ref 96–112)
CO2: 29 meq/L (ref 19–32)
Calcium: 9.6 mg/dL (ref 8.4–10.5)
Creatinine, Ser: 1.22 mg/dL (ref 0.40–1.50)
GFR: 60.01 mL/min (ref >60.00)
GLUCOSE: 144 mg/dL — AB (ref 70–99)
POTASSIUM: 4.7 meq/L (ref 3.5–5.1)
SODIUM: 136 meq/L (ref 135–145)
Total Bilirubin: 0.9 mg/dL (ref 0.2–1.2)
Total Protein: 7.3 g/dL (ref 6.0–8.3)

## 2015-01-09 LAB — CBC
HEMATOCRIT: 44.6 % (ref 39.0–52.0)
HEMOGLOBIN: 15 g/dL (ref 13.0–17.0)
MCHC: 33.6 g/dL (ref 30.0–36.0)
MCV: 94.4 fl (ref 78.0–100.0)
Platelets: 231 10*3/uL (ref 150.0–400.0)
RBC: 4.72 Mil/uL (ref 4.22–5.81)
RDW: 13.9 % (ref 11.5–15.5)
WBC: 8.6 10*3/uL (ref 4.0–10.5)

## 2015-01-09 LAB — LIPID PANEL
CHOL/HDL RATIO: 4
Cholesterol: 243 mg/dL — ABNORMAL HIGH (ref 0–200)
HDL: 61.4 mg/dL (ref >39.00)
LDL CALC: 153 mg/dL — AB (ref 0–99)
NONHDL: 181.81
Triglycerides: 144 mg/dL (ref 0.0–149.0)
VLDL: 28.8 mg/dL (ref 0.0–40.0)

## 2015-01-09 LAB — TSH: TSH: 1.21 u[IU]/mL (ref 0.35–4.50)

## 2015-01-09 LAB — HEMOGLOBIN A1C: Hgb A1c MFr Bld: 7.3 % — ABNORMAL HIGH (ref 4.6–6.5)

## 2015-01-09 NOTE — Patient Instructions (Signed)
Thank you for choosing [facility].  Summary/Instructions:  Please stop by the lab on the basement level of the building for your blood work. Your results will be released to MyChart (or called to you) after review, usually within 72 hours after test completion. If any changes need to be made, you will be notified at that same time.  If your symptoms worsen or fail to improve, please contact our office for further instruction, or in case of emergency go directly to the emergency room at the closest medical facility.   Health Maintenance  Topic Date Due  . INFLUENZA VACCINE  12/16/2014  . HEMOGLOBIN A1C  01/06/2015  . FOOT EXAM  02/20/2015  . OPHTHALMOLOGY EXAM  06/18/2015  . TETANUS/TDAP  11/23/2022  . ZOSTAVAX  Completed  . PNA vac Low Risk Adult  Completed    Health Maintenance A healthy lifestyle and preventative care can promote health and wellness.  Maintain regular health, dental, and eye exams.  Eat a healthy diet. Foods like vegetables, fruits, whole grains, low-fat dairy products, and lean protein foods contain the nutrients you need and are low in calories. Decrease your intake of foods high in solid fats, added sugars, and salt. Get information about a proper diet from your health care provider, if necessary.  Regular physical exercise is one of the most important things you can do for your health. Most adults should get at least 150 minutes of moderate-intensity exercise (any activity that increases your heart rate and causes you to sweat) each week. In addition, most adults need muscle-strengthening exercises on 2 or more days a week.   Maintain a healthy weight. The body mass index (BMI) is a screening tool to identify possible weight problems. It provides an estimate of body fat based on height and weight. Your health care provider can find your BMI and can help you achieve or maintain a healthy weight. For males 20 years and older:  A BMI below 18.5 is considered  underweight.  A BMI of 18.5 to 24.9 is normal.  A BMI of 25 to 29.9 is considered overweight.  A BMI of 30 and above is considered obese.  Maintain normal blood lipids and cholesterol by exercising and minimizing your intake of saturated fat. Eat a balanced diet with plenty of fruits and vegetables. Blood tests for lipids and cholesterol should begin at age 43 and be repeated every 5 years. If your lipid or cholesterol levels are high, you are over age 75, or you are at high risk for heart disease, you may need your cholesterol levels checked more frequently.Ongoing high lipid and cholesterol levels should be treated with medicines if diet and exercise are not working.  If you smoke, find out from your health care provider how to quit. If you do not use tobacco, do not start.  Lung cancer screening is recommended for adults aged 55-80 years who are at high risk for developing lung cancer because of a history of smoking. A yearly low-dose CT scan of the lungs is recommended for people who have at least a 30-pack-year history of smoking and are current smokers or have quit within the past 15 years. A pack year of smoking is smoking an average of 1 pack of cigarettes a day for 1 year (for example, a 30-pack-year history of smoking could mean smoking 1 pack a day for 30 years or 2 packs a day for 15 years). Yearly screening should continue until the smoker has stopped smoking for at  least 15 years. Yearly screening should be stopped for people who develop a health problem that would prevent them from having lung cancer treatment.  If you choose to drink alcohol, do not have more than 2 drinks per day. One drink is considered to be 12 oz (360 mL) of beer, 5 oz (150 mL) of wine, or 1.5 oz (45 mL) of liquor.  Avoid the use of street drugs. Do not share needles with anyone. Ask for help if you need support or instructions about stopping the use of drugs.  High blood pressure causes heart disease and  increases the risk of stroke. Blood pressure should be checked at least every 1-2 years. Ongoing high blood pressure should be treated with medicines if weight loss and exercise are not effective.  If you are 7445-79 years old, ask your health care provider if you should take aspirin to prevent heart disease.  Diabetes screening involves taking a blood sample to check your fasting blood sugar level. This should be done once every 3 years after age 79 if you are at a normal weight and without risk factors for diabetes. Testing should be considered at a younger age or be carried out more frequently if you are overweight and have at least 1 risk factor for diabetes.  Colorectal cancer can be detected and often prevented. Most routine colorectal cancer screening begins at the age of 79 and continues through age 79. However, your health care provider may recommend screening at an earlier age if you have risk factors for colon cancer. On a yearly basis, your health care provider may provide home test kits to check for hidden blood in the stool. A small camera at the end of a tube may be used to directly examine the colon (sigmoidoscopy or colonoscopy) to detect the earliest forms of colorectal cancer. Talk to your health care provider about this at age 79 when routine screening begins. A direct exam of the colon should be repeated every 5-10 years through age 79, unless early forms of precancerous polyps or small growths are found.  People who are at an increased risk for hepatitis B should be screened for this virus. You are considered at high risk for hepatitis B if:  You were born in a country where hepatitis B occurs often. Talk with your health care provider about which countries are considered high risk.  Your parents were born in a high-risk country and you have not received a shot to protect against hepatitis B (hepatitis B vaccine).  You have HIV or AIDS.  You use needles to inject street  drugs.  You live with, or have sex with, someone who has hepatitis B.  You are a man who has sex with other men (MSM).  You get hemodialysis treatment.  You take certain medicines for conditions like cancer, organ transplantation, and autoimmune conditions.  Hepatitis C blood testing is recommended for all people born from [id number] through 1965 and any individual with known risk factors for hepatitis C.  Healthy men should no longer receive prostate-specific antigen (PSA) blood tests as part of routine cancer screening. Talk to your health care provider about prostate cancer screening.  Testicular cancer screening is not recommended for adolescents or adult males who have no symptoms. Screening includes self-exam, a health care provider exam, and other screening tests. Consult with your health care provider about any symptoms you have or any concerns you have about testicular cancer.  Practice safe sex. Use condoms and avoid high-risk  sexual practices to reduce the spread of sexually transmitted infections (STIs).  You should be screened for STIs, including gonorrhea and chlamydia if:  You are sexually active and are younger than 24 years.  You are older than 24 years, and your health care provider tells you that you are at risk for this type of infection.  Your sexual activity has changed since you were last screened, and you are at an increased risk for chlamydia or gonorrhea. Ask your health care provider if you are at risk.  If you are at risk of being infected with HIV, it is recommended that you take a prescription medicine daily to prevent HIV infection. This is called pre-exposure prophylaxis (PrEP). You are considered at risk if:  You are a man who has sex with other men (MSM).  You are a heterosexual man who is sexually active with multiple partners.  You take drugs by injection.  You are sexually active with a partner who has HIV.  Talk with your health care provider about  whether you are at high risk of being infected with HIV. If you choose to begin PrEP, you should first be tested for HIV. You should then be tested every 3 months for as long as you are taking PrEP.  Use sunscreen. Apply sunscreen liberally and repeatedly throughout the day. You should seek shade when your shadow is shorter than you. Protect yourself by wearing long sleeves, pants, a wide-brimmed hat, and sunglasses year round whenever you are outdoors.  Tell your health care provider of new moles or changes in moles, especially if there is a change in shape or color. Also, tell your health care provider if a mole is larger than the size of a pencil eraser.  A one-time screening for abdominal aortic aneurysm (AAA) and surgical repair of large AAAs by ultrasound is recommended for men aged 65-75 years who are current or former smokers.  Stay current with your vaccines (immunizations). Document Released: 10/30/2007 Document Revised: 05/08/2013 Document Reviewed: 09/28/2010 [facility] Patient Information 2015 [place], [place]. This information is not intended to replace advice given to you by your health care provider. Make sure you discuss any questions you have with your health care provider.

## 2015-01-09 NOTE — Progress Notes (Signed)
Subjective:    Patient ID: Juan Carroll, male    DOB: 12/04/1927, 81 y.o.   MRN: [id number]  Chief Complaint  Patient presents with  . CPE    fasting     HPI:  Juan Carroll is a 81 y.o. male who presents today for an annual wellness visit.   1) Health Maintenance -   Diet - Averages about 3-4 meals per day consisting of fruits, vegetables, chicken, pork, and dairy; 1 cup of caffeine daily  Exercise - Play golf and walks   2) Preventative Exams / Immunizations:  Dental -- Has dentures  Vision -- Up to date   Health Maintenance  Topic Date Due  . INFLUENZA VACCINE  12/16/2014  . HEMOGLOBIN A1C  01/06/2015  . FOOT EXAM  02/20/2015  . OPHTHALMOLOGY EXAM  06/18/2015  . TETANUS/TDAP  11/23/2022  . ZOSTAVAX  Completed  . PNA vac Low Risk Adult  Completed     Immunization History  Administered Date(s) Administered  . Influenza Split 03/06/2012  . Influenza, High Dose Seasonal PF 01/09/2015  . Influenza,inj,Quad PF,36+ Mos 04/13/2013  . Pneumococcal Conjugate-13 01/09/2015  . Pneumococcal Polysaccharide-23 03/17/1998  . Tetanus 11/22/2012  . Zoster 11/22/2012     RISK FACTORS  Tobacco History  Smoking status  . Former Smoker  Smokeless tobacco  . Never Used     Cardiac risk factors: advanced age (older than 26 for men, 44 for women), diabetes mellitus, dyslipidemia, hypertension and male gender.  Depression Screen  Q1: Over the past two weeks, have you felt down, depressed or hopeless? No  Q2: Over the past two weeks, have you felt little interest or pleasure in doing things? No  Have you lost interest or pleasure in daily life? No  Do you often feel hopeless? No  Do you cry easily over simple problems? No  Activities of Daily Living In your present state of health, do you have any difficulty performing the following activities?:  Driving? No Managing money?  No Feeding yourself? No Getting from bed to chair? No Climbing a flight of stairs?  No Preparing food and eating?: No Bathing or showering? No Getting dressed: No Getting to the toilet? No Using the toilet: No Moving around from place to place: No In the past year have you fallen or had a near fall?:No   Home Safety Has smoke detector and wears seat belts. No firearms. No excess sun exposure. Are there smokers in your home (other than you)?  No Do you feel safe at home?  Yes  Hearing Difficulties: No Do you often ask people to speak up or repeat themselves? Yes Do you experience ringing or noises in your ears? No  Do you have difficulty understanding soft or whispered voices? No    Cognitive Testing  Alert? Yes   Normal Appearance? Yes  Oriented to person? Yes  Place? Yes   Time? Yes  Recall of three objects?  Yes  Can perform simple calculations? Yes  Displays appropriate judgment? Yes  Can read the correct time from a watch face? Yes  Do you feel that you have a problem with memory? No  Do you often misplace items? Yes   Advanced Directives have been discussed with the patient? Yes   Current Physicians/Providers and Suppliers  1. Genella Mech, MD - Primary Care Provider / Internal Medicine 2. Max Al Corpus, DPM - Podiatry  Indicate any recent Medical Services you may have received from other than [facility] providers in  the past year (date may be approximate).  All answers were reviewed with the patient and necessary referrals were made:  Jeanine Luz, FNP   01/09/2015   Allergies  Allergen Reactions  . Sulfa Antibiotics      Outpatient Prescriptions Prior to Visit  Medication Sig Dispense Refill  . donepezil (ARICEPT) 10 MG tablet Take 1 tablet (10 mg total) by mouth at bedtime as needed. 10 tablet 0  . finasteride (PROSCAR) 5 MG tablet Take 5 mg by mouth daily.    . metFORMIN (GLUCOPHAGE) 1000 MG tablet Take 1 tablet (1,000 mg total) by mouth 2 (two) times daily with a meal. 20 tablet 0  . metoprolol succinate (TOPROL-XL) 25 MG 24 hr tablet Take  1 tablet (25 mg total) by mouth daily. 30 tablet 3  . ramipril (ALTACE) 10 MG capsule Take 10 mg by mouth daily.    Marland Kitchen warfarin (COUMADIN) 5 MG tablet Take as directed by anticoagulation clinic 10 tablet 0   No facility-administered medications prior to visit.     Past Medical History  Diagnosis Date  . Degeneration of intervertebral disc, site unspecified   . Occlusion and stenosis of carotid artery without mention of cerebral infarction   . Unspecified essential hypertension   . Inguinal hernia without mention of obstruction or gangrene, unilateral or unspecified, (not specified as recurrent)   . Memory loss   . Peripheral vascular disease, unspecified   . Type II or unspecified type diabetes mellitus without mention of complication, not stated as uncontrolled   . Coronary atherosclerosis of unspecified type of vessel, native or graft   . Elevated prostate specific antigen (PSA)   . Atrial fibrillation   . Other and unspecified hyperlipidemia   . Chronic atrial fibrillation     on rate control, medicines and coumadin  . Hypertension   . Hyperlipemia   . Carotid stenosis     status post left carotid endarterectomy in 2008     Past Surgical History  Procedure Laterality Date  . Flexible sigmoidoscopy  04-28-98  . Carotid duplex  11-15-06  . Electrocardiogram  11-02-06  . Carotid endartectomy      left  . Hemorrhoid surgery    . Inguinal hernia repair      right     Family History  Problem Relation Age of Onset  . Stroke Mother   . Parkinsonism Brother      Social History   Social History  . Marital Status: Widowed    Spouse Name: N/A  . Number of Children: 2  . 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 Smoking status: Former Games developer  . Smokeless tobacco: Never Used  . Alcohol Use: No  . Drug Use: No  . Sexual Activity:    Partners: Female    Other Topics Concern  . Not on file   Social History Narrative   Widowed; married in 1960. Enjoys playing lots of golf. Has a grown daughter (1962) who is his primary care-taker and son (1966), several grandchildren. 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 Discussed end of life care: he does not favor CPR or mechanical ventilator support.     Review of Systems  Constitutional: Denies fever, chills, fatigue, or significant weight gain/loss. HENT: Head: Denies headache or neck pain  Ears: Denies changes in hearing, ringing in ears, earache, drainage Nose: Denies discharge, stuffiness, itching, nosebleed, sinus pain Throat: Denies sore throat, hoarseness, dry mouth, sores, thrush Eyes: Denies loss/changes in vision, pain, redness, blurry/double vision, flashing lights Cardiovascular: Denies chest pain/discomfort, tightness, palpitations, shortness of breath with activity, difficulty lying down, swelling, sudden awakening with shortness of breath Respiratory: Denies shortness of breath, cough, sputum production, wheezing Gastrointestinal: Denies dysphasia, heartburn, change in appetite, nausea, change in bowel habits, rectal bleeding, constipation, diarrhea, yellow skin or eyes Occasional incontinence Genitourinary: Denies frequency, urgency, burning/pain, blood in urine, incontinence, change in urinary strength. Musculoskeletal: Denies muscle/joint pain, stiffness, back pain, redness or swelling of joints, trauma Skin: Denies rashes, lumps, itching, dryness, color changes, or hair/nail changes Neurological: Denies dizziness, fainting, seizures, weakness, numbness, tingling, tremor Psychiatric - Denies nervousness, stress, depression or memory loss Endocrine: Denies heat or cold intolerance, sweating, frequent urination, excessive thirst, changes in appetite Hematologic: Denies ease of bruising or bleeding     Objective:     BP 142/88 mmHg  Pulse 59  Temp(Src) 98.1 F (36.7 C) (Oral)  Resp 16  Ht  (1.88 m)  Wt 207 lb (93.895 kg)  BMI 26.57 kg/m2  SpO2 95% Nursing note and vital signs reviewed.  Physical Exam  Constitutional: He is oriented to person, place, and time. He appears well-developed and well-nourished.  HENT:  Head: Normocephalic.  Right Ear: Hearing, tympanic membrane, external ear and ear canal normal.  Left Ear: Hearing, tympanic membrane, external ear and ear canal normal.  Nose: Nose normal.  Mouth/Throat: Uvula is midline, oropharynx is clear and moist and mucous membranes are normal.  Eyes: Conjunctivae and EOM are normal. Pupils are equal, round, and reactive to light.  Neck: Neck supple. No JVD present. No tracheal deviation present. No thyromegaly present.  Cardiovascular: Normal rate, regular rhythm, normal heart sounds and intact distal pulses.   Pulmonary/Chest: Effort normal and breath sounds normal.  Abdominal: Soft. Bowel sounds are normal. He exhibits no distension and no mass. There is no tenderness. There is no rebound and no guarding.  Musculoskeletal: Normal range of motion. He exhibits no edema or tenderness.  Lymphadenopathy:    He has no cervical adenopathy.  Neurological: He is alert and oriented to person, place, and time. He has normal reflexes. No cranial nerve deficit. He exhibits normal muscle tone. Coordination normal.  Skin: Skin is warm and dry.  Psychiatric: He has a normal mood and affect. His behavior is normal. Judgment and thought content normal.       Assessment & Plan:   During the course of the visit the patient was educated and counseled about appropriate screening and preventive services including:    Pneumococcal vaccine   Influenza vaccine  Td vaccine  Prostate cancer screening  Diabetes screening  Nutrition counseling   Diet review for nutrition referral? Yes ____  Not Indicated _X___   Patient  Instructions (the written plan) was given to the patient.  Medicare Attestation I have personally reviewed: The patient's medical and social history Their use of alcohol, tobacco or illicit drugs Their current medications and supplements The patient's functional ability including ADLs,fall risks, home safety risks, cognitive, and hearing and visual impairment Diet and physical activities Evidence for depression or mood disorders  The patient's weight, height, BMI,  have been recorded in the chart.  I have made referrals, counseling, and provided education to the patient based on review of the above and I have provided the patient with  a written personalized care plan for preventive services.     Jeanine Luz, FNP   01/09/2015    Problem List Items Addressed This Visit      Other   Routine general medical examination at a health care facility    1) Anticipatory Guidance: Discussed importance of wearing a seatbelt while driving and not texting while driving; changing batteries in smoke detector at least once annually; wearing suntan lotion when outside; eating a balanced and moderate diet; getting physical activity at least 30 minutes per day.  2) Immunizations / Screenings / Labs:  Prevnar and High dose flu vaccinations given today. All other immunizations are up-to-date per recommendations. I'll screenings are up-to-date per recommendations. Obtain CBC, CMET, Lipid profile and TSH.   Overall well exam. Patient has several risk factors for cardiovascular disease including hypertension, diabetes, and hyperlipidemia. He reports that he takes his medication as prescribed and his chronic conditions are adequately controlled with current medication regimen. He does have decreasing memory per family. He is currently maintained on Aricept. No evidence of delirium as he is alert and oriented today. He is hard of hearing and has hearing aids which he does not wear consistently. He is also been  noted to have some loose stools and occasional incontinence. This is possibly related to high-dose metformin. Decrease metformin to 500 mg daily as a trial. Follow-up prevention exam in 1 year. Follow-up office visit pending blood work.       Relevant Orders   Lipid panel   TSH   Comprehensive metabolic panel   CBC   Hemoglobin A1c   Medicare annual wellness visit, subsequent - Primary    Reviewed and updated patient's medical, surgical, family and social history. Medications and allergies were also reviewed. Basic screenings for depression, activities of daily living, hearing, cognition and safety were performed. Provider list was updated and health plan was provided to the patient.        Other Visit Diagnoses    Need for vaccination with 13-polyvalent pneumococcal conjugate vaccine        Relevant Orders    Pneumococcal conjugate vaccine 13-valent IM (Completed)    Encounter for immunization

## 2015-01-09 NOTE — Assessment & Plan Note (Signed)
1) Anticipatory Guidance: Discussed importance of wearing a seatbelt while driving and not texting while driving; changing batteries in smoke detector at least once annually; wearing suntan lotion when outside; eating a balanced and moderate diet; getting physical activity at least 30 minutes per day.  2) Immunizations / Screenings / Labs:  Prevnar and High dose flu vaccinations given today. All other immunizations are up-to-date per recommendations. I'll screenings are up-to-date per recommendations. Obtain CBC, CMET, Lipid profile and TSH.   Overall well exam. Patient has several risk factors for cardiovascular disease including hypertension, diabetes, and hyperlipidemia. He reports that he takes his medication as prescribed and his chronic conditions are adequately controlled with current medication regimen. He does have decreasing memory per family. He is currently maintained on Aricept. No evidence of delirium as he is alert and oriented today. He is hard of hearing and has hearing aids which he does not wear consistently. He is also been noted to have some loose stools and occasional incontinence. This is possibly related to high-dose metformin. Decrease metformin to 500 mg daily as a trial. Follow-up prevention exam in 1 year. Follow-up office visit pending blood work.

## 2015-01-09 NOTE — Assessment & Plan Note (Signed)
Reviewed and updated patient's medical, surgical, family and social history. Medications and allergies were also reviewed. Basic screenings for depression, activities of daily living, hearing, cognition and safety were performed. Provider list was updated and health plan was provided to the patient.  

## 2015-01-09 NOTE — Progress Notes (Signed)
Pre visit review using our clinic review tool, if applicable. No additional management support is needed unless otherwise documented below in the visit note. 

## 2015-01-13 ENCOUNTER — Encounter: Payer: Self-pay | Admitting: Family

## 2015-01-21 ENCOUNTER — Ambulatory Visit (INDEPENDENT_AMBULATORY_CARE_PROVIDER_SITE_OTHER): Payer: Medicare Other | Admitting: *Deleted

## 2015-01-21 DIAGNOSIS — Z7901 Long term (current) use of anticoagulants: Secondary | ICD-10-CM

## 2015-01-21 LAB — POCT INR: INR: 2.8

## 2015-02-17 ENCOUNTER — Ambulatory Visit (INDEPENDENT_AMBULATORY_CARE_PROVIDER_SITE_OTHER): Payer: Medicare Other | Admitting: *Deleted

## 2015-02-17 DIAGNOSIS — Z7901 Long term (current) use of anticoagulants: Secondary | ICD-10-CM | POA: Diagnosis not present

## 2015-02-17 LAB — POCT INR: INR: 3.1

## 2015-03-19 ENCOUNTER — Ambulatory Visit (INDEPENDENT_AMBULATORY_CARE_PROVIDER_SITE_OTHER): Payer: Medicare Other | Admitting: *Deleted

## 2015-03-19 DIAGNOSIS — Z7901 Long term (current) use of anticoagulants: Secondary | ICD-10-CM | POA: Diagnosis not present

## 2015-03-19 LAB — POCT INR: INR: 5.2

## 2015-03-19 MED ORDER — WARFARIN SODIUM 5 MG PO TABS: ORAL_TABLET | ORAL | Status: DC

## 2015-03-26 ENCOUNTER — Ambulatory Visit (INDEPENDENT_AMBULATORY_CARE_PROVIDER_SITE_OTHER): Payer: Medicare Other | Admitting: *Deleted

## 2015-03-26 DIAGNOSIS — Z7901 Long term (current) use of anticoagulants: Secondary | ICD-10-CM | POA: Diagnosis not present

## 2015-03-26 LAB — POCT INR: INR: 2.8

## 2015-03-27 ENCOUNTER — Ambulatory Visit (INDEPENDENT_AMBULATORY_CARE_PROVIDER_SITE_OTHER): Payer: Medicare Other | Admitting: Podiatry

## 2015-03-27 ENCOUNTER — Encounter: Payer: Self-pay | Admitting: Podiatry

## 2015-03-27 ENCOUNTER — Encounter: Payer: Self-pay | Admitting: Internal Medicine

## 2015-03-27 ENCOUNTER — Other Ambulatory Visit (INDEPENDENT_AMBULATORY_CARE_PROVIDER_SITE_OTHER): Payer: Medicare Other

## 2015-03-27 ENCOUNTER — Ambulatory Visit (INDEPENDENT_AMBULATORY_CARE_PROVIDER_SITE_OTHER): Payer: Medicare Other | Admitting: Internal Medicine

## 2015-03-27 VITALS — BP 132/78 | HR 74 | Temp 97.8°F | Resp 16 | Wt 201.0 lb

## 2015-03-27 DIAGNOSIS — E1121 Type 2 diabetes mellitus with diabetic nephropathy: Secondary | ICD-10-CM

## 2015-03-27 DIAGNOSIS — I482 Chronic atrial fibrillation, unspecified: Secondary | ICD-10-CM

## 2015-03-27 DIAGNOSIS — R55 Syncope and collapse: Secondary | ICD-10-CM | POA: Diagnosis not present

## 2015-03-27 DIAGNOSIS — I10 Essential (primary) hypertension: Secondary | ICD-10-CM | POA: Diagnosis not present

## 2015-03-27 DIAGNOSIS — G309 Alzheimer's disease, unspecified: Secondary | ICD-10-CM

## 2015-03-27 DIAGNOSIS — B351 Tinea unguium: Secondary | ICD-10-CM | POA: Diagnosis not present

## 2015-03-27 DIAGNOSIS — F0280 Dementia in other diseases classified elsewhere without behavioral disturbance: Secondary | ICD-10-CM

## 2015-03-27 DIAGNOSIS — M79676 Pain in unspecified toe(s): Secondary | ICD-10-CM

## 2015-03-27 LAB — CBC WITH DIFFERENTIAL/PLATELET
BASOS ABS: 0.1 10*3/uL (ref 0.0–0.1)
Basophils Relative: 1 % (ref 0.0–3.0)
EOS ABS: 0.1 10*3/uL (ref 0.0–0.7)
Eosinophils Relative: 1.4 % (ref 0.0–5.0)
HCT: 43.9 % (ref 39.0–52.0)
Hemoglobin: 14.7 g/dL (ref 13.0–17.0)
LYMPHS ABS: 2.4 10*3/uL (ref 0.7–4.0)
Lymphocytes Relative: 24.9 % (ref 12.0–46.0)
MCHC: 33.4 g/dL (ref 30.0–36.0)
MCV: 94.3 fl (ref 78.0–100.0)
MONO ABS: 0.8 10*3/uL (ref 0.1–1.0)
MONOS PCT: 8.6 % (ref 3.0–12.0)
NEUTROS PCT: 64.1 % (ref 43.0–77.0)
Neutro Abs: 6.2 10*3/uL (ref 1.4–7.7)
Platelets: 246 10*3/uL (ref 150.0–400.0)
RBC: 4.66 Mil/uL (ref 4.22–5.81)
RDW: 13.6 % (ref 11.5–15.5)
WBC: 9.7 10*3/uL (ref 4.0–10.5)

## 2015-03-27 LAB — BASIC METABOLIC PANEL
BUN: 21 mg/dL (ref 6–23)
CALCIUM: 9.6 mg/dL (ref 8.4–10.5)
CO2: 29 mEq/L (ref 19–32)
CREATININE: 1.23 mg/dL (ref 0.40–1.50)
Chloride: 97 mEq/L (ref 96–112)
GFR: 59.41 mL/min — AB (ref >60.00)
GLUCOSE: 209 mg/dL — AB (ref 70–99)
Potassium: 4.2 mEq/L (ref 3.5–5.1)
SODIUM: 134 meq/L — AB (ref 135–145)

## 2015-03-27 MED ORDER — RAMIPRIL 5 MG PO CAPS: 5.0000 mg | ORAL_CAPSULE | Freq: Every day | ORAL | Status: DC

## 2015-03-27 NOTE — Progress Notes (Signed)
He presents today with a chief complaint of painful elongated toenails.  Objective: Vital signs are stable he is alert and oriented 3. Pulses are strong with palpable. Neurologic system is intact. Toenails are thick yellow dystrophic with mycotic and painful palpation.  Assessment: Pain in limb secondary onychomycosis 1 through 5 bilateral.  Plan: Debridement of nails 1 through 5 bilateral cover service secondary to pain. Follow up with him in 3 months.

## 2015-03-27 NOTE — Progress Notes (Signed)
Pre visit review using our clinic review tool, if applicable. No additional management support is needed unless otherwise documented below in the visit note. 

## 2015-03-27 NOTE — Patient Instructions (Signed)
Perform isometric exercise of calves  ( while seated go up on toes to count of 5 & then onto heels for 5 count). Repeat  4- 5 times prior to standing if you've been seated or supine for any significant period of time as BP drops with such positions.  
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
## 2015-03-31 ENCOUNTER — Other Ambulatory Visit: Payer: Self-pay | Admitting: *Deleted

## 2015-03-31 ENCOUNTER — Other Ambulatory Visit: Payer: Self-pay | Admitting: Cardiovascular Disease

## 2015-03-31 MED ORDER — WARFARIN SODIUM 5 MG PO TABS
ORAL_TABLET | ORAL | Status: AC
Start: 2015-03-31 — End: ?

## 2015-03-31 NOTE — Telephone Encounter (Signed)
[facility] and telephoned [facility] and cancelled previous Rx that was sent on 03/19/15.

## 2015-03-31 NOTE — Telephone Encounter (Signed)
Pt's daughter Lynden AngCathy is calling requesting that [facility] [address] She stated that they are having problems with [facility]. Please advise

## 2015-04-01 ENCOUNTER — Encounter: Payer: Self-pay | Admitting: Internal Medicine

## 2015-04-04 MED ORDER — RAMIPRIL 5 MG PO CAPS: 5.0000 mg | ORAL_CAPSULE | Freq: Every day | ORAL | Status: AC

## 2015-04-04 NOTE — Telephone Encounter (Signed)
This has been faxed to the requested location.

## 2015-04-09 ENCOUNTER — Ambulatory Visit (INDEPENDENT_AMBULATORY_CARE_PROVIDER_SITE_OTHER): Payer: Medicare Other | Admitting: *Deleted

## 2015-04-09 DIAGNOSIS — Z7901 Long term (current) use of anticoagulants: Secondary | ICD-10-CM

## 2015-04-09 LAB — POCT INR: INR: 4

## 2015-04-18 ENCOUNTER — Ambulatory Visit (INDEPENDENT_AMBULATORY_CARE_PROVIDER_SITE_OTHER): Payer: Medicare Other | Admitting: Pharmacist

## 2015-04-18 DIAGNOSIS — Z7901 Long term (current) use of anticoagulants: Secondary | ICD-10-CM

## 2015-04-18 LAB — POCT INR: INR: 4

## 2015-04-21 ENCOUNTER — Encounter (HOSPITAL_COMMUNITY): Payer: Medicare Other

## 2015-04-21 ENCOUNTER — Telehealth: Payer: Self-pay | Admitting: Internal Medicine

## 2015-04-21 MED ORDER — LIDOCAINE VISCOUS 2 % MT SOLN: 20.0000 mL | OROMUCOSAL | Status: AC | PRN

## 2015-04-21 NOTE — Telephone Encounter (Signed)
Tried to reach patient. No answer and voice mail has not been set up. I need to inform him that Dr. Okey Duprerawford sent in some mouthwash.

## 2015-04-21 NOTE — Telephone Encounter (Signed)
Please call and let him know that we did send in liquid lidocaine that he can swish in his mouth for about 30 seconds then spit to help with pain of mouth ulcers. There is no need for magic mouthwash for mouth ulcers. This is for fungal infection in the mouth.

## 2015-04-21 NOTE — Telephone Encounter (Signed)
Patient's daughter called requesting rx for miracle mouthwash for the ulcers in the patient's mouth. They use [facility] on [address] CB# [phone]

## 2015-04-28 ENCOUNTER — Ambulatory Visit (INDEPENDENT_AMBULATORY_CARE_PROVIDER_SITE_OTHER): Payer: Medicare Other | Admitting: *Deleted

## 2015-04-28 DIAGNOSIS — Z7901 Long term (current) use of anticoagulants: Secondary | ICD-10-CM

## 2015-04-28 LAB — POCT INR: INR: 5

## 2015-05-07 ENCOUNTER — Ambulatory Visit (INDEPENDENT_AMBULATORY_CARE_PROVIDER_SITE_OTHER): Payer: Medicare Other | Admitting: *Deleted

## 2015-05-07 DIAGNOSIS — Z7901 Long term (current) use of anticoagulants: Secondary | ICD-10-CM | POA: Diagnosis not present

## 2015-05-07 LAB — POCT INR: INR: 2.8

## 2015-05-21 ENCOUNTER — Ambulatory Visit (INDEPENDENT_AMBULATORY_CARE_PROVIDER_SITE_OTHER): Payer: Medicare Other | Admitting: *Deleted

## 2015-05-21 DIAGNOSIS — Z7901 Long term (current) use of anticoagulants: Secondary | ICD-10-CM | POA: Diagnosis not present

## 2015-05-21 LAB — POCT INR: INR: 3.8

## 2015-06-04 ENCOUNTER — Ambulatory Visit (INDEPENDENT_AMBULATORY_CARE_PROVIDER_SITE_OTHER): Payer: Medicare Other | Admitting: Pharmacist

## 2015-06-04 DIAGNOSIS — Z7901 Long term (current) use of anticoagulants: Secondary | ICD-10-CM | POA: Diagnosis not present

## 2015-06-04 LAB — POCT INR: INR: 3.2

## 2015-06-20 ENCOUNTER — Ambulatory Visit (INDEPENDENT_AMBULATORY_CARE_PROVIDER_SITE_OTHER): Payer: Medicare Other | Admitting: *Deleted

## 2015-06-20 DIAGNOSIS — Z7901 Long term (current) use of anticoagulants: Secondary | ICD-10-CM | POA: Diagnosis not present

## 2015-06-20 LAB — POCT INR: INR: 2.6

## 2015-06-26 ENCOUNTER — Ambulatory Visit: Payer: Medicare Other | Admitting: Podiatry

## 2015-07-01 ENCOUNTER — Ambulatory Visit (INDEPENDENT_AMBULATORY_CARE_PROVIDER_SITE_OTHER): Payer: Medicare Other | Admitting: Podiatry

## 2015-07-01 ENCOUNTER — Encounter: Payer: Self-pay | Admitting: Podiatry

## 2015-07-01 DIAGNOSIS — B351 Tinea unguium: Secondary | ICD-10-CM | POA: Diagnosis not present

## 2015-07-01 DIAGNOSIS — M79676 Pain in unspecified toe(s): Secondary | ICD-10-CM | POA: Diagnosis not present

## 2015-07-01 NOTE — Progress Notes (Signed)
He presents today with a chief complaint of painful elongated toenails.  Objective: Vital signs are stable he is alert and oriented 3 pulses are palpable. No skin breakdown is noted. His toenails are long thick yellow incurvated painful on palpation as well as debridement.  Assessment: Pain in limb secondary to onychomycosis.  Plan: Debridement of toenails 1 through 5 bilateral covered service secondary to pain and follow-up with him in 3 months.

## 2015-07-11 ENCOUNTER — Ambulatory Visit (INDEPENDENT_AMBULATORY_CARE_PROVIDER_SITE_OTHER): Payer: Medicare Other | Admitting: *Deleted

## 2015-07-11 DIAGNOSIS — Z7901 Long term (current) use of anticoagulants: Secondary | ICD-10-CM

## 2015-07-11 LAB — POCT INR: INR: 2.2

## 2015-08-08 ENCOUNTER — Ambulatory Visit (INDEPENDENT_AMBULATORY_CARE_PROVIDER_SITE_OTHER): Payer: Medicare Other | Admitting: *Deleted

## 2015-08-08 DIAGNOSIS — Z7901 Long term (current) use of anticoagulants: Secondary | ICD-10-CM

## 2015-08-08 LAB — POCT INR: INR: 2.7

## 2015-08-17 ENCOUNTER — Encounter (HOSPITAL_COMMUNITY): Payer: Self-pay

## 2015-08-17 ENCOUNTER — Inpatient Hospital Stay (HOSPITAL_COMMUNITY)
Admission: EM | Admit: 2015-08-17 | Discharge: 2015-09-15 | DRG: 871 | Disposition: E | Payer: Medicare Other | Attending: Internal Medicine | Admitting: Internal Medicine

## 2015-08-17 ENCOUNTER — Emergency Department (HOSPITAL_COMMUNITY): Payer: Medicare Other

## 2015-08-17 DIAGNOSIS — Z823 Family history of stroke: Secondary | ICD-10-CM

## 2015-08-17 DIAGNOSIS — I2510 Atherosclerotic heart disease of native coronary artery without angina pectoris: Secondary | ICD-10-CM | POA: Diagnosis present

## 2015-08-17 DIAGNOSIS — Z820 Family history of epilepsy and other diseases of the nervous system: Secondary | ICD-10-CM

## 2015-08-17 DIAGNOSIS — R531 Weakness: Secondary | ICD-10-CM | POA: Diagnosis not present

## 2015-08-17 DIAGNOSIS — Z515 Encounter for palliative care: Secondary | ICD-10-CM | POA: Insufficient documentation

## 2015-08-17 DIAGNOSIS — I482 Chronic atrial fibrillation, unspecified: Secondary | ICD-10-CM

## 2015-08-17 DIAGNOSIS — R6510 Systemic inflammatory response syndrome (SIRS) of non-infectious origin without acute organ dysfunction: Secondary | ICD-10-CM | POA: Diagnosis present

## 2015-08-17 DIAGNOSIS — I739 Peripheral vascular disease, unspecified: Secondary | ICD-10-CM | POA: Diagnosis present

## 2015-08-17 DIAGNOSIS — R05 Cough: Secondary | ICD-10-CM

## 2015-08-17 DIAGNOSIS — I10 Essential (primary) hypertension: Secondary | ICD-10-CM | POA: Diagnosis present

## 2015-08-17 DIAGNOSIS — F0390 Unspecified dementia without behavioral disturbance: Secondary | ICD-10-CM

## 2015-08-17 DIAGNOSIS — A419 Sepsis, unspecified organism: Secondary | ICD-10-CM | POA: Diagnosis not present

## 2015-08-17 DIAGNOSIS — E1151 Type 2 diabetes mellitus with diabetic peripheral angiopathy without gangrene: Secondary | ICD-10-CM | POA: Diagnosis present

## 2015-08-17 DIAGNOSIS — L8992 Pressure ulcer of unspecified site, stage 2: Secondary | ICD-10-CM | POA: Diagnosis present

## 2015-08-17 DIAGNOSIS — R9431 Abnormal electrocardiogram [ECG] [EKG]: Secondary | ICD-10-CM

## 2015-08-17 DIAGNOSIS — I6523 Occlusion and stenosis of bilateral carotid arteries: Secondary | ICD-10-CM | POA: Diagnosis present

## 2015-08-17 DIAGNOSIS — R0902 Hypoxemia: Secondary | ICD-10-CM

## 2015-08-17 DIAGNOSIS — E785 Hyperlipidemia, unspecified: Secondary | ICD-10-CM | POA: Diagnosis present

## 2015-08-17 DIAGNOSIS — G9340 Encephalopathy, unspecified: Secondary | ICD-10-CM | POA: Diagnosis present

## 2015-08-17 DIAGNOSIS — F0280 Dementia in other diseases classified elsewhere without behavioral disturbance: Secondary | ICD-10-CM | POA: Diagnosis present

## 2015-08-17 DIAGNOSIS — Z87891 Personal history of nicotine dependence: Secondary | ICD-10-CM

## 2015-08-17 DIAGNOSIS — J690 Pneumonitis due to inhalation of food and vomit: Secondary | ICD-10-CM | POA: Diagnosis present

## 2015-08-17 DIAGNOSIS — R0602 Shortness of breath: Secondary | ICD-10-CM

## 2015-08-17 DIAGNOSIS — L8990 Pressure ulcer of unspecified site, unspecified stage: Secondary | ICD-10-CM | POA: Diagnosis present

## 2015-08-17 DIAGNOSIS — R509 Fever, unspecified: Secondary | ICD-10-CM

## 2015-08-17 DIAGNOSIS — I4891 Unspecified atrial fibrillation: Secondary | ICD-10-CM | POA: Diagnosis present

## 2015-08-17 DIAGNOSIS — R262 Difficulty in walking, not elsewhere classified: Secondary | ICD-10-CM | POA: Diagnosis present

## 2015-08-17 DIAGNOSIS — Z79899 Other long term (current) drug therapy: Secondary | ICD-10-CM

## 2015-08-17 DIAGNOSIS — R059 Cough, unspecified: Secondary | ICD-10-CM

## 2015-08-17 DIAGNOSIS — Z66 Do not resuscitate: Secondary | ICD-10-CM | POA: Diagnosis present

## 2015-08-17 DIAGNOSIS — Z7984 Long term (current) use of oral hypoglycemic drugs: Secondary | ICD-10-CM

## 2015-08-17 DIAGNOSIS — J189 Pneumonia, unspecified organism: Secondary | ICD-10-CM | POA: Diagnosis present

## 2015-08-17 DIAGNOSIS — Z7901 Long term (current) use of anticoagulants: Secondary | ICD-10-CM

## 2015-08-17 DIAGNOSIS — Z7189 Other specified counseling: Secondary | ICD-10-CM | POA: Insufficient documentation

## 2015-08-17 DIAGNOSIS — G309 Alzheimer's disease, unspecified: Secondary | ICD-10-CM | POA: Diagnosis present

## 2015-08-17 DIAGNOSIS — E1121 Type 2 diabetes mellitus with diabetic nephropathy: Secondary | ICD-10-CM | POA: Diagnosis present

## 2015-08-17 DIAGNOSIS — I214 Non-ST elevation (NSTEMI) myocardial infarction: Secondary | ICD-10-CM | POA: Diagnosis present

## 2015-08-17 LAB — DIFFERENTIAL
BASOS ABS: 0 10*3/uL (ref 0.0–0.1)
BASOS PCT: 0 %
Eosinophils Absolute: 0.1 10*3/uL (ref 0.0–0.7)
Eosinophils Relative: 1 %
LYMPHS PCT: 9 %
Lymphs Abs: 0.8 10*3/uL (ref 0.7–4.0)
MONO ABS: 0.9 10*3/uL (ref 0.1–1.0)
MONOS PCT: 10 %
NEUTROS ABS: 7.1 10*3/uL (ref 1.7–7.7)
Neutrophils Relative %: 80 %

## 2015-08-17 LAB — COMPREHENSIVE METABOLIC PANEL
ALK PHOS: 129 U/L — AB (ref 38–126)
ALT: 14 U/L — AB (ref 17–63)
AST: 27 U/L (ref 15–41)
Albumin: 4.2 g/dL (ref 3.5–5.0)
Anion gap: 12 (ref 5–15)
BUN: 16 mg/dL (ref 6–20)
CALCIUM: 9.5 mg/dL (ref 8.9–10.3)
CO2: 22 mmol/L (ref 22–32)
CREATININE: 1.01 mg/dL (ref 0.61–1.24)
Chloride: 98 mmol/L — ABNORMAL LOW (ref 101–111)
GFR calc non Af Amer: >60 mL/min (ref >60)
Glucose, Bld: 112 mg/dL — ABNORMAL HIGH (ref 65–99)
Potassium: 4.5 mmol/L (ref 3.5–5.1)
SODIUM: 132 mmol/L — AB (ref 135–145)
Total Bilirubin: 0.8 mg/dL (ref 0.3–1.2)
Total Protein: 7.1 g/dL (ref 6.5–8.1)

## 2015-08-17 LAB — URINE MICROSCOPIC-ADD ON: WBC, UA: NONE SEEN WBC/hpf (ref 0–5)

## 2015-08-17 LAB — URINALYSIS, ROUTINE W REFLEX MICROSCOPIC
BILIRUBIN URINE: NEGATIVE
GLUCOSE, UA: NEGATIVE mg/dL
KETONES UR: 15 mg/dL — AB
Leukocytes, UA: NEGATIVE
Nitrite: NEGATIVE
PROTEIN: 100 mg/dL — AB
Specific Gravity, Urine: 1.019 (ref 1.005–1.030)
pH: 7 (ref 5.0–8.0)

## 2015-08-17 LAB — I-STAT TROPONIN, ED: Troponin i, poc: 0.05 ng/mL (ref 0.00–0.08)

## 2015-08-17 LAB — TROPONIN I: TROPONIN I: 0.07 ng/mL — AB (ref <0.031)

## 2015-08-17 LAB — CBG MONITORING, ED: Glucose-Capillary: 116 mg/dL — ABNORMAL HIGH (ref 65–99)

## 2015-08-17 LAB — CBC
HEMATOCRIT: 42.6 % (ref 39.0–52.0)
Hemoglobin: 14.5 g/dL (ref 13.0–17.0)
MCH: 31.5 pg (ref 26.0–34.0)
MCHC: 34 g/dL (ref 30.0–36.0)
MCV: 92.4 fL (ref 78.0–100.0)
PLATELETS: 193 10*3/uL (ref 150–400)
RBC: 4.61 MIL/uL (ref 4.22–5.81)
RDW: 13.4 % (ref 11.5–15.5)
WBC: 8.9 10*3/uL (ref 4.0–10.5)

## 2015-08-17 LAB — PROTIME-INR
INR: 1.97 — AB (ref 0.00–1.49)
Prothrombin Time: 22.3 seconds — ABNORMAL HIGH (ref 11.6–15.2)

## 2015-08-17 LAB — I-STAT CG4 LACTIC ACID, ED: LACTIC ACID, VENOUS: 1.01 mmol/L (ref 0.5–2.0)

## 2015-08-17 LAB — APTT: aPTT: 34 seconds (ref 24–37)

## 2015-08-17 MED ORDER — DEXTROSE 5 % IV SOLN
1.0000 g | Freq: Once | INTRAVENOUS | Status: AC
  Administered 2015-08-17: 1 g via INTRAVENOUS
  Filled 2015-08-17: qty 10

## 2015-08-17 MED ORDER — RAMIPRIL 5 MG PO CAPS
5.0000 mg | ORAL_CAPSULE | Freq: Once | ORAL | Status: AC
  Administered 2015-08-17: 5 mg via ORAL
  Filled 2015-08-17: qty 1

## 2015-08-17 MED ORDER — DEXTROSE 5 % IV SOLN
500.0000 mg | Freq: Once | INTRAVENOUS | Status: AC
  Administered 2015-08-17: 500 mg via INTRAVENOUS
  Filled 2015-08-17: qty 500

## 2015-08-17 MED ORDER — METOPROLOL SUCCINATE ER 25 MG PO TB24
25.0000 mg | ORAL_TABLET | Freq: Once | ORAL | Status: AC
  Administered 2015-08-17: 25 mg via ORAL
  Filled 2015-08-17: qty 1

## 2015-08-17 MED ORDER — SODIUM CHLORIDE 0.9 % IV SOLN
INTRAVENOUS | Status: DC
  Administered 2015-08-17: 23:00:00 via INTRAVENOUS

## 2015-08-17 NOTE — ED Notes (Signed)
Pts daughter reports pts face is very red, which is abnormal for pt.

## 2015-08-17 NOTE — ED Provider Notes (Addendum)
CSN: [id number]     Arrival date & time 08/31/2015  1827 History   First MD Initiated Contact with Patient 09/01/2015 2029     Chief Complaint  Patient presents with  . Gait Problem     (Consider location/radiation/quality/duration/timing/severity/associated sxs/prior Treatment) The history is provided by the patient and a relative. The history is limited by the condition of the patient.  Patient w history advanced dementia, presents w family who noticed that patient seemed generally weak today, less able to ambulate without assistance, and appeared 'red in face'. ?felt warm, but no fever on arrival to ED.  Pt at baseline w advanced dementia, not able to communicate how he is feeling, or have any type of conversation. No report of trauma or fall. No syncope. Has not had any of his meds today, but did get regularly up until today. No recent change in meds or new meds. Family has noted recent increased cough (non productive) and congestion. No vomiting or diarrhea. Has been eating normally. No change in urine noted, incontinent at baseline. Patient not verbally responsive - level 5 caveat.        Past Medical History  Diagnosis Date  . Degeneration of intervertebral disc, site unspecified   . Occlusion and stenosis of carotid artery without mention of cerebral infarction   . Unspecified essential hypertension   . Inguinal hernia without mention of obstruction or gangrene, unilateral or unspecified, (not specified as recurrent)   . Memory loss   . Peripheral vascular disease, unspecified (HCC)   . Type II or unspecified type diabetes mellitus without mention of complication, not stated as uncontrolled   . Coronary atherosclerosis of unspecified type of vessel, native or graft   . Elevated prostate specific antigen (PSA)   . Atrial fibrillation (HCC)   . Other and unspecified hyperlipidemia   . Chronic atrial fibrillation (HCC)     on rate control, medicines and coumadin  . Hypertension   .  Hyperlipemia   . Carotid stenosis     status post left carotid endarterectomy in 2008   Past Surgical History  Procedure Laterality Date  . Flexible sigmoidoscopy  04-28-98  . Carotid duplex  11-15-06  . Electrocardiogram  11-02-06  . Carotid endartectomy      left  . Hemorrhoid surgery    . Inguinal hernia repair      right   Family History  Problem Relation Age of Onset  . Stroke Mother   . Parkinsonism Brother    Social History  Substance Use Topics  . Smoking status: Former Games developer  . Smokeless tobacco: Never Used  . Alcohol Use: No       Review of Systems  Unable to perform ROS: Dementia  Constitutional: Positive for fever.  Respiratory: Positive for cough.   Neurological: Positive for weakness.  level 5 caveat, pt not verbally responsive.       Allergies  Sulfa antibiotics  Home Medications   Prior to Admission medications   Medication Sig Start Date End Date Taking? Authorizing Provider  donepezil (ARICEPT) 10 MG tablet Take 1 tablet (10 mg total) by mouth at bedtime as needed. 06/13/14   Myrlene Broker, MD  finasteride (PROSCAR) 5 MG tablet Take 5 mg by mouth daily.    Historical Provider, MD  lidocaine (XYLOCAINE) 2 % solution Use as directed 20 mLs in the mouth or throat as needed for mouth pain. 04/21/15   Myrlene Broker, MD  loperamide (IMODIUM) 2 MG capsule Take 2  mg by mouth as needed for diarrhea or loose stools (M, W,Fri).    Historical Provider, MD  metFORMIN (GLUCOPHAGE) 1000 MG tablet Take 1 tablet (1,000 mg total) by mouth 2 (two) times daily with a meal. 05/20/14 05/20/15  Myrlene Broker, MD  metoprolol succinate (TOPROL-XL) 25 MG 24 hr tablet Take 1 tablet (25 mg total) by mouth daily. 06/28/14   Myrlene Broker, MD  ramipril (ALTACE) 5 MG capsule Take 1 capsule (5 mg total) by mouth daily. 04/04/15   Pecola Lawless, MD  warfarin (COUMADIN) 5 MG tablet Take as directed by anticoagulation clinic 03/31/15   Kathleene Hazel, MD   BP 164/96 mmHg  Pulse 98  Temp(Src) 98.4 F (36.9 C) (Oral)  Resp 19  SpO2 92% Physical Exam  Constitutional: He appears well-developed and well-nourished. No distress.  HENT:  Head: Atraumatic.  Nose: Nose normal.  Mouth/Throat: Oropharynx is clear and moist.  Eyes: Conjunctivae are normal. Pupils are equal, round, and reactive to light. No scleral icterus.  Neck: Neck supple. No tracheal deviation present.  No stiffness or rigidity. No bruit.   Cardiovascular: Normal rate, normal heart sounds and intact distal pulses.   No murmur heard. Irregular rhythm (pt noted in chronic afib)  Pulmonary/Chest: Effort normal and breath sounds normal. No accessory muscle usage. No respiratory distress.  occ coughing, non prod. Upper resp congestion  Abdominal: Soft. Bowel sounds are normal. He exhibits no distension and no mass. There is no tenderness. There is no rebound and no guarding.  Genitourinary:  No cva tenderness. Normal ext genitalia. No sts, or erythema. No tenderness.   Musculoskeletal: Normal range of motion. He exhibits no edema.  Lymphadenopathy:    He has no cervical adenopathy.  Neurological: He is alert.  Content and alert appearing. Moves bil extremities purposefully w good strength.  Patients mental status currently described as being c/w baseline.   Skin: Skin is warm and dry. No rash noted. He is not diaphoretic.  No cellulitis.   Psychiatric: He has a normal mood and affect.  Nursing note and vitals reviewed.   ED Course  Procedures (including critical care time) Labs Review   Results for orders placed or performed during the hospital encounter of Sep 09, 2015  Comprehensive metabolic panel  Result Value Ref Range   Sodium 132 (L) 135 - 145 mmol/L   Potassium 4.5 3.5 - 5.1 mmol/L   Chloride 98 (L) 101 - 111 mmol/L   CO2 22 22 - 32 mmol/L   Glucose, Bld 112 (H) 65 - 99 mg/dL   BUN 16 6 - 20 mg/dL   Creatinine, Ser 1.61 0.61 - 1.24 mg/dL    Calcium 9.5 8.9 - 09.6 mg/dL   Total Protein 7.1 6.5 - 8.1 g/dL   Albumin 4.2 3.5 - 5.0 g/dL   AST 27 15 - 41 U/L   ALT 14 (L) 17 - 63 U/L   Alkaline Phosphatase 129 (H) 38 - 126 U/L   Total Bilirubin 0.8 0.3 - 1.2 mg/dL   GFR calc non Af Amer >60 >60 mL/min   GFR calc Af Amer >60 >60 mL/min   Anion gap 12 5 - 15  CBC  Result Value Ref Range   WBC 8.9 4.0 - 10.5 K/uL   RBC 4.61 4.22 - 5.81 MIL/uL   Hemoglobin 14.5 13.0 - 17.0 g/dL   HCT 04.5 40.9 - 81.1 %   MCV 92.4 78.0 - 100.0 fL   MCH 31.5 26.0 - 34.0  pg   MCHC 34.0 30.0 - 36.0 g/dL   RDW 82.913.4 56.211.5 - 13.015.5 %   Platelets 193 150 - 400 K/uL  Protime-INR - (order if Patient is taking Coumadin / Warfarin)  Result Value Ref Range   Prothrombin Time 22.3 (H) 11.6 - 15.2 seconds   INR 1.97 (H) 0.00 - 1.49  APTT  Result Value Ref Range   aPTT 34 24 - 37 seconds  Differential  Result Value Ref Range   Neutrophils Relative % 80 %   Neutro Abs 7.1 1.7 - 7.7 K/uL   Lymphocytes Relative 9 %   Lymphs Abs 0.8 0.7 - 4.0 K/uL   Monocytes Relative 10 %   Monocytes Absolute 0.9 0.1 - 1.0 K/uL   Eosinophils Relative 1 %   Eosinophils Absolute 0.1 0.0 - 0.7 K/uL   Basophils Relative 0 %   Basophils Absolute 0.0 0.0 - 0.1 K/uL  Urinalysis, Routine w reflex microscopic ([facility])  Result Value Ref Range   Color, Urine YELLOW YELLOW   APPearance CLEAR CLEAR   Specific Gravity, Urine 1.019 1.005 - 1.030   pH 7.0 5.0 - 8.0   Glucose, UA NEGATIVE NEGATIVE mg/dL   Hgb urine dipstick SMALL (A) NEGATIVE   Bilirubin Urine NEGATIVE NEGATIVE   Ketones, ur 15 (A) NEGATIVE mg/dL   Protein, ur 865100 (A) NEGATIVE mg/dL   Nitrite NEGATIVE NEGATIVE   Leukocytes, UA NEGATIVE NEGATIVE  Troponin I  Result Value Ref Range   Troponin I 0.07 (H) <0.031 ng/mL  Urine microscopic-add on  Result Value Ref Range   Squamous Epithelial / LPF 0-5 (A) NONE SEEN   WBC, UA NONE SEEN 0 - 5 WBC/hpf   RBC / HPF 6-30 0 - 5 RBC/hpf   Bacteria, UA RARE (A) NONE  SEEN   Casts HYALINE CASTS (A) NEGATIVE  CBG monitoring, ED  Result Value Ref Range   Glucose-Capillary 116 (H) 65 - 99 mg/dL  I-stat troponin, ED ([facility])  Result Value Ref Range   Troponin i, poc 0.05 0.00 - 0.08 ng/mL   Comment 3          I-Stat CG4 Lactic Acid, ED  Result Value Ref Range   Lactic Acid, Venous 1.01 0.5 - 2.0 mmol/L   Ct Head Wo Contrast  08/23/2015  CLINICAL DATA:  Difficulty walking EXAM: CT HEAD WITHOUT CONTRAST TECHNIQUE: Contiguous axial images were obtained from the base of the skull through the vertex without intravenous contrast. COMPARISON:  None. FINDINGS: Considerable motion artifact is noted. No gross bony abnormality is seen. Diffuse atrophic changes are seen. No findings to suggest acute hemorrhage, acute infarction or space-occupying mass lesion are noted. Prior infarct is noted in the right frontoparietal region IMPRESSION: Chronic atrophic and ischemic changes without acute abnormality. Electronically Signed   By: Alcide CleverMark  Lukens M.D.   On: 08/23/2015 20:11   Dg Chest Port 1 View  09/03/2015  CLINICAL DATA:  Weakness EXAM: PORTABLE CHEST 1 VIEW COMPARISON:  12/25/2008 FINDINGS: Cardiac shadow is within normal limits. Aortic calcifications are again seen. The lungs are clear bilaterally. No acute bony abnormality is seen. IMPRESSION: No active disease. Electronically Signed   By: Alcide CleverMark  Lukens M.D.   On: 09/13/2015 21:10      I have personally reviewed and evaluated these images and lab results as part of my medical decision-making.   EKG Interpretation   Date/Time:  Sunday August 17 2015 19:03:35 EDT Ventricular Rate:  105 PR Interval:  QRS Duration: 96 QT Interval:  316 QTC Calculation: 417 R Axis:   40 Text Interpretation:  Atrial fibrillation with rapid ventricular response  Nonspecific ST abnormality Confirmed by Denton Lank  MD, Caryn Bee ([id number]) on  09/05/2015 8:39:31 PM      MDM   Labs.  Reviewed nursing notes and prior charts for  additional history.   Initial no fever on arrival to ed. Recheck/recal temp 101.   Family notes recent increased cough. Sounds congested.  Cultures added to workup. Lactate ok.  Will add flu test.   Will cover for possible cap.   ecg changed from prior, trop sl elev. No apparent cp or discomfort.  Will admit to med service for obs/admit.  Discussed with Dr Toniann Fail, requests obs/admit to tele.        Cathren Laine, MD 09/08/2015 [phone]

## 2015-08-17 NOTE — ED Notes (Addendum)
Pt brought in by daughter with report of pt having trouble walking. She reports he has been very unsteady and wobbly. Pts son in law noticed his change in gait today around 11am. Pt has dementia and is only alert to self, which is baseline. His daughter reports he usually is able to walk around independently. Pt denies having any pain. Strong equal hand grasps, no drift and clear speech. Daughter reports he has been saying strange things as well and that sometimes is not normal for him, either.

## 2015-08-18 ENCOUNTER — Inpatient Hospital Stay (HOSPITAL_COMMUNITY): Payer: Medicare Other

## 2015-08-18 ENCOUNTER — Encounter (HOSPITAL_COMMUNITY): Payer: Self-pay | Admitting: Internal Medicine

## 2015-08-18 DIAGNOSIS — I10 Essential (primary) hypertension: Secondary | ICD-10-CM | POA: Diagnosis present

## 2015-08-18 DIAGNOSIS — Z820 Family history of epilepsy and other diseases of the nervous system: Secondary | ICD-10-CM | POA: Diagnosis not present

## 2015-08-18 DIAGNOSIS — I214 Non-ST elevation (NSTEMI) myocardial infarction: Secondary | ICD-10-CM | POA: Diagnosis present

## 2015-08-18 DIAGNOSIS — R6510 Systemic inflammatory response syndrome (SIRS) of non-infectious origin without acute organ dysfunction: Secondary | ICD-10-CM | POA: Diagnosis not present

## 2015-08-18 DIAGNOSIS — R531 Weakness: Secondary | ICD-10-CM | POA: Diagnosis present

## 2015-08-18 DIAGNOSIS — E1121 Type 2 diabetes mellitus with diabetic nephropathy: Secondary | ICD-10-CM | POA: Diagnosis present

## 2015-08-18 DIAGNOSIS — I482 Chronic atrial fibrillation: Secondary | ICD-10-CM | POA: Diagnosis present

## 2015-08-18 DIAGNOSIS — Z79899 Other long term (current) drug therapy: Secondary | ICD-10-CM | POA: Diagnosis not present

## 2015-08-18 DIAGNOSIS — Z7984 Long term (current) use of oral hypoglycemic drugs: Secondary | ICD-10-CM | POA: Diagnosis not present

## 2015-08-18 DIAGNOSIS — L8990 Pressure ulcer of unspecified site, unspecified stage: Secondary | ICD-10-CM | POA: Diagnosis present

## 2015-08-18 DIAGNOSIS — Z7189 Other specified counseling: Secondary | ICD-10-CM | POA: Diagnosis not present

## 2015-08-18 DIAGNOSIS — Z515 Encounter for palliative care: Secondary | ICD-10-CM | POA: Diagnosis not present

## 2015-08-18 DIAGNOSIS — R262 Difficulty in walking, not elsewhere classified: Secondary | ICD-10-CM | POA: Diagnosis present

## 2015-08-18 DIAGNOSIS — I481 Persistent atrial fibrillation: Secondary | ICD-10-CM

## 2015-08-18 DIAGNOSIS — A419 Sepsis, unspecified organism: Secondary | ICD-10-CM | POA: Diagnosis present

## 2015-08-18 DIAGNOSIS — Z823 Family history of stroke: Secondary | ICD-10-CM | POA: Diagnosis not present

## 2015-08-18 DIAGNOSIS — G309 Alzheimer's disease, unspecified: Secondary | ICD-10-CM | POA: Diagnosis not present

## 2015-08-18 DIAGNOSIS — E785 Hyperlipidemia, unspecified: Secondary | ICD-10-CM | POA: Diagnosis present

## 2015-08-18 DIAGNOSIS — L8992 Pressure ulcer of unspecified site, stage 2: Secondary | ICD-10-CM | POA: Diagnosis present

## 2015-08-18 DIAGNOSIS — F0280 Dementia in other diseases classified elsewhere without behavioral disturbance: Secondary | ICD-10-CM | POA: Diagnosis present

## 2015-08-18 DIAGNOSIS — I6523 Occlusion and stenosis of bilateral carotid arteries: Secondary | ICD-10-CM | POA: Diagnosis present

## 2015-08-18 DIAGNOSIS — G9340 Encephalopathy, unspecified: Secondary | ICD-10-CM | POA: Diagnosis present

## 2015-08-18 DIAGNOSIS — E1151 Type 2 diabetes mellitus with diabetic peripheral angiopathy without gangrene: Secondary | ICD-10-CM | POA: Diagnosis present

## 2015-08-18 DIAGNOSIS — F0390 Unspecified dementia without behavioral disturbance: Secondary | ICD-10-CM | POA: Insufficient documentation

## 2015-08-18 DIAGNOSIS — R0902 Hypoxemia: Secondary | ICD-10-CM | POA: Diagnosis not present

## 2015-08-18 DIAGNOSIS — R7989 Other specified abnormal findings of blood chemistry: Secondary | ICD-10-CM

## 2015-08-18 DIAGNOSIS — J690 Pneumonitis due to inhalation of food and vomit: Secondary | ICD-10-CM | POA: Diagnosis present

## 2015-08-18 DIAGNOSIS — Z66 Do not resuscitate: Secondary | ICD-10-CM | POA: Diagnosis present

## 2015-08-18 DIAGNOSIS — Z87891 Personal history of nicotine dependence: Secondary | ICD-10-CM | POA: Diagnosis not present

## 2015-08-18 DIAGNOSIS — Z7901 Long term (current) use of anticoagulants: Secondary | ICD-10-CM | POA: Diagnosis not present

## 2015-08-18 DIAGNOSIS — I2510 Atherosclerotic heart disease of native coronary artery without angina pectoris: Secondary | ICD-10-CM | POA: Diagnosis present

## 2015-08-18 LAB — CBC WITH DIFFERENTIAL/PLATELET
BASOS PCT: 0 %
Basophils Absolute: 0 10*3/uL (ref 0.0–0.1)
EOS ABS: 0 10*3/uL (ref 0.0–0.7)
EOS PCT: 0 %
HEMATOCRIT: 40.1 % (ref 39.0–52.0)
Hemoglobin: 13.3 g/dL (ref 13.0–17.0)
Lymphocytes Relative: 6 %
Lymphs Abs: 0.9 10*3/uL (ref 0.7–4.0)
MCH: 30.6 pg (ref 26.0–34.0)
MCHC: 33.2 g/dL (ref 30.0–36.0)
MCV: 92.4 fL (ref 78.0–100.0)
MONO ABS: 1.3 10*3/uL — AB (ref 0.1–1.0)
MONOS PCT: 9 %
Neutro Abs: 13.2 10*3/uL — ABNORMAL HIGH (ref 1.7–7.7)
Neutrophils Relative %: 85 %
PLATELETS: 191 10*3/uL (ref 150–400)
RBC: 4.34 MIL/uL (ref 4.22–5.81)
RDW: 13.5 % (ref 11.5–15.5)
WBC: 15.4 10*3/uL — ABNORMAL HIGH (ref 4.0–10.5)

## 2015-08-18 LAB — CBC
HCT: 34.2 % — ABNORMAL LOW (ref 39.0–52.0)
Hemoglobin: 11.8 g/dL — ABNORMAL LOW (ref 13.0–17.0)
MCH: 31.8 pg (ref 26.0–34.0)
MCHC: 34.5 g/dL (ref 30.0–36.0)
MCV: 92.2 fL (ref 78.0–100.0)
PLATELETS: 153 10*3/uL (ref 150–400)
RBC: 3.71 MIL/uL — AB (ref 4.22–5.81)
RDW: 13.5 % (ref 11.5–15.5)
WBC: 6.2 10*3/uL (ref 4.0–10.5)

## 2015-08-18 LAB — GLUCOSE, CAPILLARY
GLUCOSE-CAPILLARY: 136 mg/dL — AB (ref 65–99)
GLUCOSE-CAPILLARY: 133 mg/dL — AB (ref 65–99)
Glucose-Capillary: 179 mg/dL — ABNORMAL HIGH (ref 65–99)
Glucose-Capillary: 137 mg/dL — ABNORMAL HIGH (ref 65–99)

## 2015-08-18 LAB — COMPREHENSIVE METABOLIC PANEL
ALBUMIN: 3.1 g/dL — AB (ref 3.5–5.0)
ALK PHOS: 101 U/L (ref 38–126)
ALT: 24 U/L (ref 17–63)
ANION GAP: 8 (ref 5–15)
AST: 64 U/L — ABNORMAL HIGH (ref 15–41)
BUN: 17 mg/dL (ref 6–20)
CALCIUM: 8.4 mg/dL — AB (ref 8.9–10.3)
CO2: 24 mmol/L (ref 22–32)
CREATININE: 1.03 mg/dL (ref 0.61–1.24)
Chloride: 100 mmol/L — ABNORMAL LOW (ref 101–111)
GFR calc Af Amer: >60 mL/min (ref >60)
GFR calc non Af Amer: >60 mL/min (ref >60)
Glucose, Bld: 148 mg/dL — ABNORMAL HIGH (ref 65–99)
Potassium: 4.2 mmol/L (ref 3.5–5.1)
SODIUM: 132 mmol/L — AB (ref 135–145)
TOTAL PROTEIN: 5.4 g/dL — AB (ref 6.5–8.1)
Total Bilirubin: 1 mg/dL (ref 0.3–1.2)

## 2015-08-18 LAB — LACTIC ACID, PLASMA
LACTIC ACID, VENOUS: 1 mmol/L (ref 0.5–2.0)
LACTIC ACID, VENOUS: 3.5 mmol/L — AB (ref 0.5–2.0)
Lactic Acid, Venous: 3.7 mmol/L (ref 0.5–2.0)

## 2015-08-18 LAB — TROPONIN I
TROPONIN I: 0.92 ng/mL — AB (ref <0.031)
Troponin I: 5.72 ng/mL (ref <0.031)
Troponin I: 13.87 ng/mL (ref <0.031)

## 2015-08-18 LAB — INFLUENZA PANEL BY PCR (TYPE A & B)
H1N1 flu by pcr: NOT DETECTED
INFLAPCR: NEGATIVE
Influenza B By PCR: NEGATIVE

## 2015-08-18 LAB — PROTIME-INR
INR: 1.97 — ABNORMAL HIGH (ref 0.00–1.49)
Prothrombin Time: 22.3 seconds — ABNORMAL HIGH (ref 11.6–15.2)

## 2015-08-18 LAB — PROCALCITONIN: Procalcitonin: <0.1 ng/mL

## 2015-08-18 MED ORDER — INSULIN ASPART 100 UNIT/ML ~~LOC~~ SOLN
0.0000 [IU] | Freq: Three times a day (TID) | SUBCUTANEOUS | Status: DC
  Administered 2015-08-18 (×3): 1 [IU] via SUBCUTANEOUS

## 2015-08-18 MED ORDER — OSELTAMIVIR PHOSPHATE 30 MG PO CAPS
30.0000 mg | ORAL_CAPSULE | Freq: Two times a day (BID) | ORAL | Status: DC
  Administered 2015-08-18 (×2): 30 mg via ORAL
  Filled 2015-08-18 (×2): qty 1

## 2015-08-18 MED ORDER — METOPROLOL TARTRATE 1 MG/ML IV SOLN
2.5000 mg | Freq: Once | INTRAVENOUS | Status: AC
  Administered 2015-08-18: 2.5 mg via INTRAVENOUS

## 2015-08-18 MED ORDER — ALBUTEROL SULFATE (2.5 MG/3ML) 0.083% IN NEBU: 2.5000 mg | INHALATION_SOLUTION | Freq: Three times a day (TID) | RESPIRATORY_TRACT | Status: DC

## 2015-08-18 MED ORDER — LEVALBUTEROL HCL 0.63 MG/3ML IN NEBU: 0.6300 mg | INHALATION_SOLUTION | RESPIRATORY_TRACT | Status: DC | PRN

## 2015-08-18 MED ORDER — SODIUM CHLORIDE 0.9 % IV BOLUS (SEPSIS)
500.0000 mL | Freq: Once | INTRAVENOUS | Status: AC
  Administered 2015-08-18: 500 mL via INTRAVENOUS

## 2015-08-18 MED ORDER — METOPROLOL TARTRATE 1 MG/ML IV SOLN
INTRAVENOUS | Status: AC
  Filled 2015-08-18: qty 5

## 2015-08-18 MED ORDER — METOPROLOL TARTRATE 1 MG/ML IV SOLN: 2.5000 mg | Freq: Four times a day (QID) | INTRAVENOUS | Status: DC | PRN

## 2015-08-18 MED ORDER — METOPROLOL TARTRATE 12.5 MG HALF TABLET
12.5000 mg | ORAL_TABLET | Freq: Two times a day (BID) | ORAL | Status: DC
  Administered 2015-08-18: 12.5 mg via ORAL
  Filled 2015-08-18 (×2): qty 1

## 2015-08-18 MED ORDER — SIMVASTATIN 20 MG PO TABS
20.0000 mg | ORAL_TABLET | Freq: Every evening | ORAL | Status: DC
  Administered 2015-08-18: 20 mg via ORAL
  Filled 2015-08-18: qty 1

## 2015-08-18 MED ORDER — AZITHROMYCIN 500 MG IV SOLR
500.0000 mg | INTRAVENOUS | Status: DC
  Filled 2015-08-18: qty 500

## 2015-08-18 MED ORDER — FINASTERIDE 5 MG PO TABS
5.0000 mg | ORAL_TABLET | Freq: Every evening | ORAL | Status: DC
  Administered 2015-08-18: 5 mg via ORAL
  Filled 2015-08-18: qty 1

## 2015-08-18 MED ORDER — INSULIN ASPART 100 UNIT/ML ~~LOC~~ SOLN: 0.0000 [IU] | SUBCUTANEOUS | Status: DC

## 2015-08-18 MED ORDER — LOPERAMIDE HCL 2 MG PO CAPS
2.0000 mg | ORAL_CAPSULE | Freq: Two times a day (BID) | ORAL | Status: DC
  Administered 2015-08-18: 2 mg via ORAL
  Filled 2015-08-18 (×2): qty 1

## 2015-08-18 MED ORDER — ASPIRIN 81 MG PO CHEW
81.0000 mg | CHEWABLE_TABLET | Freq: Once | ORAL | Status: AC
  Administered 2015-08-18: 81 mg via ORAL
  Filled 2015-08-18: qty 1

## 2015-08-18 MED ORDER — PIPERACILLIN-TAZOBACTAM 3.375 G IVPB
3.3750 g | Freq: Three times a day (TID) | INTRAVENOUS | Status: DC
  Administered 2015-08-18 – 2015-08-19 (×2): 3.375 g via INTRAVENOUS
  Filled 2015-08-18 (×4): qty 50

## 2015-08-18 MED ORDER — AZITHROMYCIN 500 MG IV SOLR
250.0000 mg | INTRAVENOUS | Status: DC
  Administered 2015-08-18: 250 mg via INTRAVENOUS
  Filled 2015-08-18 (×2): qty 250

## 2015-08-18 MED ORDER — SODIUM CHLORIDE 0.9 % IV SOLN: INTRAVENOUS | Status: DC

## 2015-08-18 MED ORDER — WARFARIN - PHARMACIST DOSING INPATIENT: Freq: Every day | Status: DC

## 2015-08-18 MED ORDER — SODIUM CHLORIDE 0.9 % IV SOLN
INTRAVENOUS | Status: DC
  Administered 2015-08-18 (×2): via INTRAVENOUS

## 2015-08-18 MED ORDER — ALBUTEROL SULFATE (2.5 MG/3ML) 0.083% IN NEBU
2.5000 mg | INHALATION_SOLUTION | Freq: Four times a day (QID) | RESPIRATORY_TRACT | Status: DC
  Administered 2015-08-18: 2.5 mg via RESPIRATORY_TRACT
  Filled 2015-08-18 (×2): qty 3

## 2015-08-18 MED ORDER — SODIUM CHLORIDE 0.9 % IV BOLUS (SEPSIS): 500.0000 mL | Freq: Once | INTRAVENOUS | Status: DC

## 2015-08-18 MED ORDER — FUROSEMIDE 10 MG/ML IJ SOLN
40.0000 mg | Freq: Once | INTRAMUSCULAR | Status: AC
  Administered 2015-08-18: 40 mg via INTRAVENOUS

## 2015-08-18 MED ORDER — WARFARIN SODIUM 5 MG PO TABS
5.0000 mg | ORAL_TABLET | ORAL | Status: DC
  Administered 2015-08-18 (×2): 5 mg via ORAL
  Filled 2015-08-18 (×2): qty 1

## 2015-08-18 MED ORDER — ALBUTEROL SULFATE (2.5 MG/3ML) 0.083% IN NEBU: 2.5000 mg | INHALATION_SOLUTION | Freq: Four times a day (QID) | RESPIRATORY_TRACT | Status: DC | PRN

## 2015-08-18 MED ORDER — DEXTROSE 5 % IV SOLN
1.0000 g | INTRAVENOUS | Status: DC
  Administered 2015-08-18: 1 g via INTRAVENOUS
  Filled 2015-08-18: qty 10

## 2015-08-18 MED ORDER — WARFARIN SODIUM 2.5 MG PO TABS: 2.5000 mg | ORAL_TABLET | ORAL | Status: DC

## 2015-08-18 MED ORDER — LEVALBUTEROL HCL 0.63 MG/3ML IN NEBU
0.6300 mg | INHALATION_SOLUTION | Freq: Four times a day (QID) | RESPIRATORY_TRACT | Status: DC
  Administered 2015-08-19 (×2): 0.63 mg via RESPIRATORY_TRACT
  Filled 2015-08-18 (×2): qty 3

## 2015-08-18 MED ORDER — DONEPEZIL HCL 10 MG PO TABS
10.0000 mg | ORAL_TABLET | Freq: Every day | ORAL | Status: DC
  Administered 2015-08-18: 10 mg via ORAL
  Filled 2015-08-18: qty 1

## 2015-08-18 MED ORDER — ACETAMINOPHEN 650 MG RE SUPP
650.0000 mg | Freq: Four times a day (QID) | RECTAL | Status: DC | PRN
Start: 2015-08-18 — End: 2015-08-19
  Administered 2015-08-18: 650 mg via RECTAL
  Filled 2015-08-18: qty 1

## 2015-08-18 MED ORDER — FUROSEMIDE 10 MG/ML IJ SOLN
INTRAMUSCULAR | Status: AC
Start: 2015-08-18 — End: 2015-08-18
  Filled 2015-08-18: qty 4

## 2015-08-18 NOTE — Evaluation (Signed)
Clinical/Bedside Swallow Evaluation Patient Details  Name: Juan Carroll MRN: [id number] Date of Birth: December 19, 1927  Today's Date: 08/18/2015 Time: SLP Start Time (ACUTE ONLY): 4098 SLP Stop Time (ACUTE ONLY): 0934 SLP Time Calculation (min) (ACUTE ONLY): 9 min  Past Medical History:  Past Medical History  Diagnosis Date  . Degeneration of intervertebral disc, site unspecified   . Occlusion and stenosis of carotid artery without mention of cerebral infarction   . Unspecified essential hypertension   . Inguinal hernia without mention of obstruction or gangrene, unilateral or unspecified, (not specified as recurrent)   . Memory loss   . Peripheral vascular disease, unspecified (HCC)   . Type II or unspecified type diabetes mellitus without mention of complication, not stated as uncontrolled   . Coronary atherosclerosis of unspecified type of vessel, native or graft   . Elevated prostate specific antigen (PSA)   . Atrial fibrillation (HCC)   . Other and unspecified hyperlipidemia   . Chronic atrial fibrillation (HCC)     on rate control, medicines and coumadin  . Hypertension   . Hyperlipemia   . Carotid stenosis     status post left carotid endarterectomy in 2008   Past Surgical History:  Past Surgical History  Procedure Laterality Date  . Flexible sigmoidoscopy  04-28-98  . Carotid duplex  11-15-06  . Electrocardiogram  11-02-06  . Carotid endartectomy      left  . Hemorrhoid surgery    . Inguinal hernia repair      right   HPI:  Juan Carroll is an 82 y.o. male with history of chronic atrial fibrillation, hypertension, diabetes mellitus type 2, dementia was admitted with increasingly confusion, weakness, and difficulty walking. Patient with productive cough and was febrile with temperature of 101F. Chest x-ray and UA are unremarkable CT scan of the head did not show anything acute.   Assessment / Plan / Recommendation Clinical Impression  Patient presents with intact  strength and coordination in oral phase of swallow as well as quick initiation of the pharngeal phase of swallow.  Patient demonstrted no overt s/s of aspiration.  Noted a cough that with intermittent in nature occuring prior to and after PO while RN assessed lung sounds and requested deep breaths.  Given clear chest x-ray recommend patient continue with current diet orders.  No SLP follow up recommended at this time.       Aspiration Risk  No limitations    Diet Recommendation Regular;Thin liquid   Liquid Administration via: Cup;Straw Medication Administration: Whole meds with liquid Supervision: Patient able to self feed;Intermittent supervision to cue for compensatory strategies (given likely cognitive deficits) Compensations: Minimize environmental distractions;Slow rate;Small sips/bites Postural Changes: Seated upright at 90 degrees    Other  Recommendations Oral Care Recommendations: Oral care BID   Follow up Recommendations  24 hour supervision/assistance;None                        Swallow Study   General HPI: Juan Carroll is an 82 y.o. male with history of chronic atrial fibrillation, hypertension, diabetes mellitus type 2, dementia was admitted with increasingly confusion, weakness, and difficulty walking. Patient with productive cough and was febrile with temperature of 101F. Chest x-ray and UA are unremarkable CT scan of the head did not show anything acute. Type of Study: Bedside Swallow Evaluation Previous Swallow Assessment: none on record Diet Prior to this Study: Regular;Thin liquids Temperature Spikes Noted: No Respiratory Status: Room air History  of Recent Intubation: No Behavior/Cognition: Cooperative;Pleasant mood;Alert Oral Cavity Assessment: Within Functional Limits Oral Cavity - Dentition: Adequate natural dentition Vision: Functional for self-feeding Self-Feeding Abilities: Able to feed self Patient Positioning: Upright in bed Baseline Vocal  Quality: Normal Volitional Cough: Strong Volitional Swallow: Able to elicit    Oral/Motor/Sensory Function Overall Oral Motor/Sensory Function: Within functional limits   Ice Chips Ice chips: Not tested   Thin Liquid Thin Liquid: Within functional limits Presentation: Cup;Self Fed;Straw    Nectar Thick Nectar Thick Liquid: Not tested   Honey Thick Honey Thick Liquid: Not tested   Puree Puree: Within functional limits Presentation: Self Fed;Spoon   Solid   GO   Solid: Within functional limits Presentation: Self Fed       Fae PippinMelissa Anguel Delapena, M.A., CCC-SLP [phone]   Miria Cappelli 08/18/2015,10:27 AM

## 2015-08-18 NOTE — Progress Notes (Signed)
UR COMPLETED  

## 2015-08-18 NOTE — Progress Notes (Signed)
Pt experiencing difficulty breathing pt O2 sats 88%. Pt placed on 4L of O2 sats up to 94 %. Md notified. Breathing treatment ordered. Respiratory notified but unable to come at the time. RN gave breathing treatment. Will continue to monitor.

## 2015-08-18 NOTE — Progress Notes (Signed)
ANTICOAGULATION CONSULT NOTE - Initial Consult  Pharmacy Consult for Warfarin  Indication: atrial fibrillation  Allergies  Allergen Reactions  . Sulfa Antibiotics Hives    Patient Measurements: Weight: 191 lb 8 oz (86.864 kg)  Vital Signs: Temp: 98.4 F (36.9 C) (04/03 0012) Temp Source: Oral (04/03 0012) BP: 133/76 mmHg (04/03 0012) Pulse Rate: 83 (04/03 0012)  Labs:  Recent Labs  08/16/2015 1847 08/30/2015 2145  HGB 14.5  --   HCT 42.6  --   PLT 193  --   APTT 34  --   LABPROT 22.3*  --   INR 1.97*  --   CREATININE 1.01  --   TROPONINI  --  0.07*    CrCl cannot be calculated (Unknown ideal weight.).   Medical History: Past Medical History  Diagnosis Date  . Degeneration of intervertebral disc, site unspecified   . Occlusion and stenosis of carotid artery without mention of cerebral infarction   . Unspecified essential hypertension   . Inguinal hernia without mention of obstruction or gangrene, unilateral or unspecified, (not specified as recurrent)   . Memory loss   . Peripheral vascular disease, unspecified (HCC)   . Type II or unspecified type diabetes mellitus without mention of complication, not stated as uncontrolled   . Coronary atherosclerosis of unspecified type of vessel, native or graft   . Elevated prostate specific antigen (PSA)   . Atrial fibrillation (HCC)   . Other and unspecified hyperlipidemia   . Chronic atrial fibrillation (HCC)     on rate control, medicines and coumadin  . Hypertension   . Hyperlipemia   . Carotid stenosis     status post left carotid endarterectomy in 2008    Assessment: 82 y/o M with confusion/weakness, continuing chronic warfarin therapy for afib, INR on admit is 1.97, potential for DDI with azithromycin, will be monitoring daily INR.  Outpatient warfarin dosing regimen is 2.5 mg on Wed/Sat and 5 mg all other days (last dose 4/1)  Goal of Therapy:  INR 2-3 Monitor platelets by anticoagulation protocol: Yes    Plan:  -Warfarin per outpatient regimen -Daily PT/INR, adjust dose as needed -Monitor for bleeding  Abran DukeLedford, Lidia Clavijo 08/18/2015,12:34 AM

## 2015-08-18 NOTE — Progress Notes (Signed)
ANTICOAGULATION CONSULT NOTE  Pharmacy Consult for Warfarin  Indication: atrial fibrillation  Allergies  Allergen Reactions  . Sulfa Antibiotics Hives    Patient Measurements: Weight: 191 lb 8 oz (86.864 kg)  Vital Signs: Temp: 98.2 F (36.8 C) (04/03 0714) Temp Source: Axillary (04/03 0714) BP: 134/81 mmHg (04/03 0714) Pulse Rate: 99 (04/03 0714)  Labs:  Recent Labs  08/28/2015 1847 08/27/2015 2145 08/18/15 0035 08/18/15 0609  HGB 14.5  --   --  11.8*  HCT 42.6  --   --  34.2*  PLT 193  --   --  153  APTT 34  --   --   --   LABPROT 22.3*  --   --  22.3*  INR 1.97*  --   --  1.97*  CREATININE 1.01  --   --  1.03  TROPONINI  --  0.07* 0.92* 5.72*    CrCl cannot be calculated (Unknown ideal weight.).   Assessment: 82 y/o M with confusion/weakness, continuing chronic warfarin therapy for afib, INR continues at 1.97, potential for DDI with azithromycin. Hg 14.5>> 11.8 (possibly dilutional)  Outpatient warfarin dosing regimen is 2.5 mg on Wed/Sat and 5 mg all other days (last dose 4/1)  Goal of Therapy:  INR 2-3 Monitor platelets by anticoagulation protocol: Yes   Plan:  -Warfarin per outpatient regimen -Daily PT/INR, adjust dose as needed -CBC in am  Harland GermanAndrew Slate Debroux, Pharm D 08/18/2015 8:30 AM

## 2015-08-18 NOTE — Progress Notes (Addendum)
Lab called at 0130 with critical lab value of lactic acid 3.5 and troponin of 0.92. MD on called notified at 0136. Pt is asymptomatic and in no distress. No complaints of CP. He is resting comfortably. Will await orders and monitor pt closely.

## 2015-08-18 NOTE — Progress Notes (Signed)
Pt arrived to floor with NT. He is oriented x1. He does have dementia. He's pleasantly confused. No distress noted from pt. His daughter walked to pt's room and as soon as pt got up to floor she went home. Vitals are stable. Will continue to monitor pt.

## 2015-08-18 NOTE — Progress Notes (Signed)
Lab called with critical lab for troponin of 5.72 at 0710. MD notified at 0715 of critical lab value and pt was resting comfortably, asymptomatic, and does not complain of any chest pain. EKG was performed. Will continue to monitor pt closely.

## 2015-08-18 NOTE — Progress Notes (Signed)
Pt had a 5 beat run of v-tach. He is resting comfortably with no signs of distress. When asked if he was experiencing chest pain or discomfort he stated no. Will continue to monitor pt closely.

## 2015-08-18 NOTE — Consult Note (Signed)
CARDIOLOGY CONSULT NOTE   Patient ID: Juan Carroll MRN: [id number] DOB/AGE: 10/31/1927 82 y.o.  Admit date: 09/02/2015  Primary Physician   Myrlene Broker, MD Primary Cardiologist   Dr. Clifton James Reason for Consultation   Elevated troponin Requesting Physician  Dr. Thedore Mins  HPI: Juan Carroll is a 82 y.o. male with a history of non obstructive CAD on remote cardiac catheterization, chronic atrial fib on coumadin (previosly on Xarelto), carotid artery disease s.p left CEA 2008, HTN, DM, HL and advanced dementia who brought to ED 08/23/2015 by family due to confusion.   Last carotid Dopplers were in 03/2014 showing 40-59% bilateral ICA stenosis. > 50% RCEA stenosis -> repeat study in 1 year.  Last seen in clinic 12/06/2012.   No family at bedside currently. Hx obtained from chart. The patient does not know why he is in hospital. The patient brought to ER by daughter due to unsteady gait, weakness and confusion. Denied chest pain, sob, n/v/d. In the ER patient was signed productive cough and was febrile with temperature of 10 52F. Patient's caregiver recently diagnosed with influenza. CXR and UA in unremarkable. Lactic acid 1.01->3.5-->1.0.  roponin of 0.07-> 0.92-->5.72. INR of 1.97. Influenza negative. D/C IV fluids. Started on abx prophylactic for possible pneumonia. Head Ct showed chronic atrophic and ischemic changes without acute abnormality. Currently he is confused, answerers yes and no questions and goes back to sleep. No CP or SOB.   Echo 01/2009 showed LV Ef of 60%, mild MR, moderately dilated LA, mild dilated RA. EKG on admission showed afib at rate of 105bpm, non specific ST abnormality. EKG this morning showed afib at controlled rate. Q wave anterior inferiorally.   Past Medical History  Diagnosis Date  . Degeneration of intervertebral disc, site unspecified   . Occlusion and stenosis of carotid artery without mention of cerebral infarction   . Unspecified essential  hypertension   . Inguinal hernia without mention of obstruction or gangrene, unilateral or unspecified, (not specified as recurrent)   . Memory loss   . Peripheral vascular disease, unspecified (HCC)   . Type II or unspecified type diabetes mellitus without mention of complication, not stated as uncontrolled   . Coronary atherosclerosis of unspecified type of vessel, native or graft   . Elevated prostate specific antigen (PSA)   . Atrial fibrillation (HCC)   . Other and unspecified hyperlipidemia   . Chronic atrial fibrillation (HCC)     on rate control, medicines and coumadin  . Hypertension   . Hyperlipemia   . Carotid stenosis     status post left carotid endarterectomy in 2008     Past Surgical History  Procedure Laterality Date  . Flexible sigmoidoscopy  04-28-98  . Carotid duplex  11-15-06  . Electrocardiogram  11-02-06  . Carotid endartectomy      left  . Hemorrhoid surgery    . Inguinal hernia repair      right    Allergies  Allergen Reactions  . Sulfa Antibiotics Hives    I have reviewed the patient's current medications . azithromycin  500 mg Intravenous Q24H  . cefTRIAXone (ROCEPHIN)  IV  1 g Intravenous Q24H  . donepezil  10 mg Oral QHS  . finasteride  5 mg Oral QPM  . insulin aspart  0-9 Units Subcutaneous TID WC  . loperamide  2 mg Oral BID  . metoprolol tartrate  12.5 mg Oral BID  . oseltamivir  30 mg Oral BID  . simvastatin  20 mg Oral QPM  . [START ON 08/20/2015] warfarin  2.5 mg Oral Once per day on Wed Sat  . warfarin  5 mg Oral Once per day on Sun Mon Tue Thu Fri  . Warfarin - Pharmacist Dosing Inpatient   Does not apply q1800   . sodium chloride 75 mL/hr at 08/18/15 0320     Prior to Admission medications   Medication Sig Start Date End Date Taking? Authorizing Provider  donepezil (ARICEPT) 10 MG tablet Take 1 tablet (10 mg total) by mouth at bedtime as needed. Patient taking differently: Take 10 mg by mouth at bedtime.  06/13/14  Yes Myrlene BrokerElizabeth A  Crawford, MD  finasteride (PROSCAR) 5 MG tablet Take 5 mg by mouth every evening.    Yes Historical Provider, MD  loperamide (IMODIUM) 2 MG capsule Take 2 mg by mouth 2 (two) times daily.    Yes Historical Provider, MD  metFORMIN (GLUCOPHAGE) 1000 MG tablet Take 1 tablet (1,000 mg total) by mouth 2 (two) times daily with a meal. 05/20/14 09/14/2015 Yes Myrlene BrokerElizabeth A Crawford, MD  metoprolol tartrate (LOPRESSOR) 25 MG tablet Take 12.5 mg by mouth 2 (two) times daily.   Yes Historical Provider, MD  ramipril (ALTACE) 5 MG capsule Take 1 capsule (5 mg total) by mouth daily. 04/04/15  Yes Pecola LawlessWilliam F Hopper, MD  simvastatin (ZOCOR) 20 MG tablet Take 20 mg by mouth every evening.   Yes Historical Provider, MD  warfarin (COUMADIN) 5 MG tablet Take as directed by anticoagulation clinic Patient taking differently: Take 2.5-5 mg by mouth daily at 6 PM. Takes 2.5mg  on Wed and Fri  Takes 5mg  all other days 03/31/15  Yes Kathleene Hazelhristopher D McAlhany, MD  lidocaine (XYLOCAINE) 2 % solution Use as directed 20 mLs in the mouth or throat as needed for mouth pain. 04/21/15   Myrlene BrokerElizabeth A Crawford, MD     Social History   Social History  . Marital Status: Widowed    Spouse Name: N/A  . Number of Children: 2  . Years of Education: N/A   Occupational History  . 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 Smoking status: Former Games developermoker  . Smokeless tobacco: Never Used  . Alcohol Use: No  . Drug Use: No  . Sexual Activity:    Partners: Female   Other Topics Concern  . Not on file   Social History Narrative   Widowed; married in 1960. Enjoys playing lots of golf. Has a grown daughter (1962) who is his primary care-taker and son (1966), several grandchildren. 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 He still plays a lot of golf in his retirement. Discussed end of life care: he does not favor CPR or mechanical ventilator support.      Family Status  Relation Status Death Age  . Mother Deceased 2474  . Brother Deceased   . Father Deceased 4093  . Sister Deceased 2560's  . Daughter Alive     [phone]  . Son Alive     1966   Family History  Problem Relation Age of Onset  . Stroke Mother   . Parkinsonism Brother        ROS:  Full 14 point review of systems complete and found to be negative unless listed above.  Physical Exam: Blood pressure 134/81, pulse 99, temperature 98.2 F (36.8 C), temperature source Axillary, resp. rate 18, weight 191 lb 8 oz (  86.864 kg), SpO2 93 %.  General: Demented  male in no acute distress. Answeres yes and no questions and goes back to sleep.  Head: Eyes PERRLA, No xanthomas. Normocephalic and atraumatic, oropharynx without edema or exudate.  Lungs: Resp regular and unlabored, CTA. Heart: Ir Ir  no s3, s4, or murmurs..   Neck: No carotid bruits. No lymphadenopathy. No JVD. Abdomen: Bowel sounds present, abdomen soft and non-tender without masses or hernias noted. Msk:  No spine or cva tenderness. No weakness, no joint deformities or effusions. Extremities: No clubbing, cyanosis or edema. DP/PT/Radials 2+ and equal bilaterally. Neuro: oriented to name only. Move all extremities spontaneously.   Psych:  Dementia Skin: No rashes or lesions noted.  Labs:   Lab Results  Component Value Date   WBC 6.2 08/18/2015   HGB 11.8* 08/18/2015   HCT 34.2* 08/18/2015   MCV 92.2 08/18/2015   PLT 153 08/18/2015    Recent Labs  08/18/15 0609  INR 1.97*    Recent Labs Lab 08/18/15 0609  NA 132*  K 4.2  CL 100*  CO2 24  BUN 17  CREATININE 1.03  CALCIUM 8.4*  PROT 5.4*  BILITOT 1.0  ALKPHOS 101  ALT 24  AST 64*  GLUCOSE 148*  ALBUMIN 3.1*   No results found for: MG  Recent Labs  Sep 08, 2015 2145 08/18/15 0035 08/18/15 0609  TROPONINI 0.07* 0.92* 5.72*    Recent Labs  09/08/2015 2150  TROPIPOC 0.05    Echo: 01/24/09 Study Conclusions  1. Left ventricle: The cavity size  was normal. Wall thickness was  increased in a pattern of mild LVH. The estimated ejection  fraction was 60%. Wall motion was normal; there were no regional  wall motion abnormalities. 2. Aortic valve: Sclerosis without stenosis. 3. Mitral valve: Mild regurgitation. 4. Left atrium: The atrium was moderately dilated. 5. Right ventricle: The cavity size was mildly dilated. Systolic  function was normal. 6. Right atrium: The atrium was mildly dilated.  ECG:  EKG on admission showed afib at rate of 105bpm, non specific ST abnormality. EKG this morning showed afib at controlled rate. Q wave anterior inferiorally.    Radiology:  Ct Head Wo Contrast  09/08/15  CLINICAL DATA:  Difficulty walking EXAM: CT HEAD WITHOUT CONTRAST TECHNIQUE: Contiguous axial images were obtained from the base of the skull through the vertex without intravenous contrast. COMPARISON:  None. FINDINGS: Considerable motion artifact is noted. No gross bony abnormality is seen. Diffuse atrophic changes are seen. No findings to suggest acute hemorrhage, acute infarction or space-occupying mass lesion are noted. Prior infarct is noted in the right frontoparietal region IMPRESSION: Chronic atrophic and ischemic changes without acute abnormality. Electronically Signed   By: Alcide Clever M.D.   On: 2015-09-08 20:11   Dg Chest Port 1 View  September 08, 2015  CLINICAL DATA:  Weakness EXAM: PORTABLE CHEST 1 VIEW COMPARISON:  12/25/2008 FINDINGS: Cardiac shadow is within normal limits. Aortic calcifications are again seen. The lungs are clear bilaterally. No acute bony abnormality is seen. IMPRESSION: No active disease. Electronically Signed   By: Alcide Clever M.D.   On: 09-08-2015 21:10    ASSESSMENT AND PLAN:     1. Elevated troponin - Troponin of  0.07-> 0.92-->5.72. EKG with non-specific ST abnormality and Q wave anterior inferiorally. The patient had advanced dementia. No bedside family to access functional status. Likely  medical management in current setting. Continue cycle troponin.  Will discuss with MD.   2. CAD - Non obstructive CAD  on remote cardiac catheterization. Last seen in clinic 12/06/2012. Unknown reason why he stopped seeing his cardiologist.  -Last echo 01/2009 showed LV Ef of 60%, mild MR, moderately dilated LA, mild dilated RA.  3. Chronic Afib - Rate was minimally elevated upon presentation. Currently stable @ 70-80s. Transiently goes to 50. Continue lopressor 12.5mg  BID.  - CHADSVASCs score of last least 5. Continue coumadin per pharmacy.    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  Agree with above as stated.  Currently pain free.  Troponin is high but due to his dementia, we would not pursue invasive testing.  Echo only if there are sx of heart failure.  He appears mildly SHOB, but has a clear CXR.  ? Respiratory cause.  He is being treated with antibiotics.  Daurice Ovando S.

## 2015-08-18 NOTE — Progress Notes (Signed)
[facility] Progress Note                                                                                                                                                                                                                      Patient Demographics:    Juan Carroll, is a 82 y.o. male, DOB - 12/11/1927, ZOX:[id number]  Admit date - Sep 07, 2015   Admitting Physician Eduard Clos, MD  Outpatient Primary MD for the patient is Myrlene Broker, MD  LOS - 0  Outpatient Specialists:   Chief Complaint  Patient presents with  . Gait Problem        Subjective:    Juan Carroll today has, No headache, No chest pain, No abdominal pain - No Nausea, No new weakness tingling or numbness, No Cough - SOB. Unreliable historian   Assessment  & Plan :     1.Sepsis due to community-acquired pneumonia. Much improved on IV empiric antibiotics which will be continued, continue supportive care with oxygen and nebulizer treatments, follow cultures. Speech has cleared the patient from aspiration standpoint.  2. NSTEMI. Currently on combination of Coumadin, beta blocker and statin for secondary prevention. Cardiology on board. Due to combination of pneumonia, advanced dementia patient is not a candidate for invasive procedures or intervention. Continue medical treatment. He is chest pain-free.  3. Advanced dementia. At risk for delirium. Minimize narcotics and benzodiazepines, continue home medications which include Aricept.  4. Essential hypertension. Continue combination of beta blocker and ACE inhibitor.  5. Dyslipidemia. Home dose statin continued.  6. Chronic atrial fibrillation. [place] vasc 2 score of greater than 4. On beta blocker and Coumadin, pharmacy monitoring INR. Goal is rate control.  7. DM type II. Glucophage held, on sliding scale.  Lab Results  Component Value  Date   HGBA1C 7.3* 01/09/2015   CBG (last 3)   Recent Labs  09/07/2015 2039 08/18/15 0108 08/18/15 0624  GLUCAP 116* 179* 137*      Code Status : DNR  Family Communication  : None  Disposition Plan  : TBD  Barriers For Discharge : CAP  Consults  :  CARDS  Procedures  :    DVT Prophylaxis  :  Coumadin  Lab Results  Component Value Date   INR 1.97* 08/18/2015   INR 1.97* 2015/09/07   INR 2.7 08/08/2015   PROTIME 21.2 11/01/2008     Lab Results  Component Value Date   PLT 153 08/18/2015    Antibiotics  :  Anti-infectives    Start     Dose/Rate Route Frequency Ordered Stop   08/18/15 2200  azithromycin (ZITHROMAX) 500 mg in dextrose 5 % 250 mL IVPB     500 mg 250 mL/hr over 60 Minutes Intravenous Every 24 hours 08/18/15 0024     08/18/15 2200  cefTRIAXone (ROCEPHIN) 1 g in dextrose 5 % 50 mL IVPB     1 g 100 mL/hr over 30 Minutes Intravenous Every 24 hours 08/18/15 0033     08/18/15 0030  oseltamivir (TAMIFLU) capsule 30 mg     30 mg Oral 2 times daily 08/18/15 0024 08/22/15 2159   2015-09-05 2300  cefTRIAXone (ROCEPHIN) 1 g in dextrose 5 % 50 mL IVPB     1 g 100 mL/hr over 30 Minutes Intravenous  Once 09-05-2015 2254 05-Sep-2015 2356   09/05/15 2300  azithromycin (ZITHROMAX) 500 mg in dextrose 5 % 250 mL IVPB     500 mg 250 mL/hr over 60 Minutes Intravenous  Once 2015-09-05 2254 08/18/15 0033        Objective:   Filed Vitals:   Sep 05, 2015 2330 08/18/15 0012 08/18/15 0630 08/18/15 0714  BP: 147/75 133/76 111/87 134/81  Pulse: 96 83 80 99  Temp:  98.4 F (36.9 C) 98.7 F (37.1 C) 98.2 F (36.8 C)  TempSrc:  Oral Axillary Axillary  Resp: Weight:  86.864 kg (191 lb 8 oz)    SpO2: 93% 93% 94% 93%    Wt Readings from Last 3 Encounters:  08/18/15 86.864 kg (191 lb 8 oz)  03/27/15 91.173 kg (201 lb)  01/09/15 93.895 kg (207 lb)     Intake/Output Summary (Last 24 hours) at 08/18/15 1126 Last data filed at 08/18/15 0730  Gross per 24 hour    Intake    140 ml  Output      0 ml  Net    140 ml     Physical Exam  Awake, pleasantly confused, No new F.N deficits, Normal affect [place].AT,PERRAL Supple Neck,No JVD, No cervical lymphadenopathy appriciated.  Symmetrical Chest wall movement, Good air movement bilaterally, CTAB RRR,No Gallops,Rubs or new Murmurs, No Parasternal Heave +ve B.Sounds, Abd Soft, No tenderness, No organomegaly appriciated, No rebound - guarding or rigidity. No Cyanosis, Clubbing or edema, No new Rash or bruise       Data Review:    CBC  Recent Labs Lab September 05, 2015 1847 08/18/15 0609  WBC 8.9 6.2  HGB 14.5 11.8*  HCT 42.6 34.2*  PLT 193 153  MCV 92.4 92.2  MCH 31.5 31.8  MCHC 34.0 34.5  RDW 13.4 13.5  LYMPHSABS 0.8  --   MONOABS 0.9  --   EOSABS 0.1  --   BASOSABS 0.0  --     Chemistries   Recent Labs Lab 09-05-2015 1847 08/18/15 0609  NA 132* 132*  Carroll 4.5 4.2  CL 98* 100*  CO2 22 24  GLUCOSE 112* 148*  BUN 16 17  CREATININE 1.01 1.03  CALCIUM 9.5 8.4*  AST 27 64*  ALT 14* 24  ALKPHOS 129* 101  BILITOT 0.8 1.0   ------------------------------------------------------------------------------------------------------------------ No results for input(s): CHOL, HDL, LDLCALC, TRIG, CHOLHDL, LDLDIRECT in the last 72 hours.  Lab Results  Component Value Date   HGBA1C 7.3* 01/09/2015   ------------------------------------------------------------------------------------------------------------------ No results for input(s): TSH, T4TOTAL, T3FREE, THYROIDAB in the last 72 hours.  Invalid input(s): FREET3 ------------------------------------------------------------------------------------------------------------------ No results for input(s): VITAMINB12, FOLATE, FERRITIN, TIBC, IRON, RETICCTPCT in the last 72  hours.  Coagulation profile  Recent Labs Lab 10-29-2015 1847 08/18/15 0609  INR 1.97* 1.97*    No results for input(s): DDIMER in the last 72 hours.  Cardiac  Enzymes  Recent Labs Lab 10-29-2015 2145 08/18/15 0035 08/18/15 0609  TROPONINI 0.07* 0.92* 5.72*   ------------------------------------------------------------------------------------------------------------------ No results found for: BNP  Inpatient Medications  Scheduled Meds: . azithromycin  500 mg Intravenous Q24H  . cefTRIAXone (ROCEPHIN)  IV  1 g Intravenous Q24H  . donepezil  10 mg Oral QHS  . finasteride  5 mg Oral QPM  . insulin aspart  0-9 Units Subcutaneous TID WC  . loperamide  2 mg Oral BID  . metoprolol tartrate  12.5 mg Oral BID  . oseltamivir  30 mg Oral BID  . simvastatin  20 mg Oral QPM  . [START ON 08/20/2015] warfarin  2.5 mg Oral Once per day on Wed Sat  . warfarin  5 mg Oral Once per day on Sun Mon Tue Thu Fri  . Warfarin - Pharmacist Dosing Inpatient   Does not apply q1800   Continuous Infusions: . sodium chloride 75 mL/hr at 08/18/15 0320   PRN Meds:.  Micro Results No results found for this or any previous visit (from the past 240 hour(s)).  Radiology Reports Ct Head Wo Contrast  08/31/2015  CLINICAL DATA:  Difficulty walking EXAM: CT HEAD WITHOUT CONTRAST TECHNIQUE: Contiguous axial images were obtained from the base of the skull through the vertex without intravenous contrast. COMPARISON:  None. FINDINGS: Considerable motion artifact is noted. No gross bony abnormality is seen. Diffuse atrophic changes are seen. No findings to suggest acute hemorrhage, acute infarction or space-occupying mass lesion are noted. Prior infarct is noted in the right frontoparietal region IMPRESSION: Chronic atrophic and ischemic changes without acute abnormality. Electronically Signed   By: Alcide CleverMark  Lukens M.D.   On: 2015-07-13 20:11   Dg Chest Port 1 View  08/18/2015  CLINICAL DATA:  Weakness EXAM: PORTABLE CHEST 1 VIEW COMPARISON:  12/25/2008 FINDINGS: Cardiac shadow is within normal limits. Aortic calcifications are again seen. The lungs are clear bilaterally. No acute bony  abnormality is seen. IMPRESSION: No active disease. Electronically Signed   By: Alcide CleverMark  Lukens M.D.   On: 2015-07-13 21:10    Time Spent in minutes  35   Juan Carroll M.D on 08/18/2015 at 11:26 AM  Between 7am to 7pm - Pager - [phone]  After 7pm go to [email or web address] - password [email or web address]  [facility] -  Office  [phone]

## 2015-08-18 NOTE — Care Management Important Message (Signed)
Important Message  Patient Details  Name: Juan Carroll MRN: [id number] Date of Birth: 09/01/1927   Medicare Important Message Given:  Yes    [name] Abena 08/18/2015, 12:02 PM

## 2015-08-18 NOTE — Progress Notes (Addendum)
Earlier in shift, RN paged because pt was having moderate respiratory distress with increased RR and hypoxia. Lungs wet/congested. Lasix 40mg  IV x 1, Metoprolol 2.5mg  IV x 1, and CXR ordered. Asked RN to try and place NRB but pt has dementia and is pulling at O2 and lines.  Later, NP to bedside.  S: pt can not participate in ROS secondary to dementia. Per RN, pt's daughter was giving pt fluids earlier today.  O: Frail, elderly acutely ill appearing WM in NAD. Not oriented. Alert. RR normal now. No increased WOB now. HR 120s Afib. BP 140s. Lungs with congestion. Skin is pale and damp.  A/P: 1. Acute respiratory distress, resolving after treatment. CXR + for PNA, RUL RLL. Suspect he may have aspirated. Presentation was similar. Keep HOB elevated. NPO for now. SLP eval tomorrow. Continue Zithromycin and add Zosyn. Stop Rocephin.  2. Chronic Afib. Change nebs to Xopenex due to tachycardia. Use prn Metoprolol IV prn tonight.  3. DM II-sugar OK. NPO now, may need D5 in fluids if hypoglycemic.  4. Dementia-likely advanced. Can not participate in history. Somewhat restless. Will use mittens.  5. Fever-IVF bolus. LA elevated. Tylenol.  Called pt's daughter and updated her on pt's status. Confirmed pt is a DNR. Will keep pt on nsg floor and watch for now. Should he worsen, will call daughter back. Would not want to bipap this old gentleman with dementia and in face of aspiration. Will follow.  Jimmye NormanKaren Kirby-Graham, NP [facility] Update: LA 3.7. Increased IVF and gave bolus. WBCC increased to 15 from 6. Highly suspect aspiration. Pt satting high 90s on 4L [place].  KJKG, NP [facility] Update: LA trending upwards. Bolus 1L over 3 hours. HR coming down. Fever lowered.  KJKG, NP [facility] Update: BP 130s. RR 20s. O2 sat upper 90s on 4L. A little congested. LA continues to rise. T down to 99. Voiding adequately.  Report off to oncoming attending at 0700. KJKG, NP [facility]

## 2015-08-18 NOTE — H&P (Addendum)
[facility] History and Physical  Juan Carroll ZOX:[id number] DOB: [birth year] DOA: 09/09/2015  Referring physician: Dr.Steinl. PCP: Myrlene Broker, MD  Specialists: None.  Chief Complaint: Confusion and weakness.  History obtained from patient's daughter. Patient has dementia.  HPI: Juan Carroll is a 82 y.o. male with history of chronic atrial fibrillation, hypertension, diabetes mellitus type 2, dementia was brought to the ER the patient was found to be increasingly confused weakness and difficulty walking since yesterday morning. Patient did not have any nausea vomiting diarrhea abdominal pain chest pain. In the ER patient was signed productive cough and was febrile with temperature of 10 47F. Chest x-ray and UA are unremarkable CT scan of the head did not show anything acute. Patient's family stated that patient's caregiver was recently diagnosed yesterday with influenza. Patient will be admitted for further management of acute encephalopathy and SIRS probably from developing pneumonia. On exam patient is oriented to his name and moves all extremities.   Review of Systems: As presented in the history of presenting illness, rest negative.  Past Medical History  Diagnosis Date  . Degeneration of intervertebral disc, site unspecified   . Occlusion and stenosis of carotid artery without mention of cerebral infarction   . Unspecified essential hypertension   . Inguinal hernia without mention of obstruction or gangrene, unilateral or unspecified, (not specified as recurrent)   . Memory loss   . Peripheral vascular disease, unspecified (HCC)   . Type II or unspecified type diabetes mellitus without mention of complication, not stated as uncontrolled   . Coronary atherosclerosis of unspecified type of vessel, native or graft   . Elevated prostate specific antigen (PSA)   . Atrial fibrillation (HCC)   . Other and unspecified hyperlipidemia   . Chronic atrial fibrillation (HCC)      on rate control, medicines and coumadin  . Hypertension   . Hyperlipemia   . Carotid stenosis     status post left carotid endarterectomy in 2008   Past Surgical History  Procedure Laterality Date  . Flexible sigmoidoscopy  04-28-98  . Carotid duplex  11-15-06  . Electrocardiogram  11-02-06  . Carotid endartectomy      left  . Hemorrhoid surgery    . Inguinal hernia repair      right   Social History:  reports that he has quit smoking. He has never used smokeless tobacco. He reports that he does not drink alcohol or use illicit drugs. Where does patient live home. Can patient participate in ADLs? No.  Allergies  Allergen Reactions  . Sulfa Antibiotics Hives    Family History:  Family History  Problem Relation Age of Onset  . Stroke Mother   . Parkinsonism Brother       Prior to Admission medications   Medication Sig Start Date End Date Taking? Authorizing Provider  donepezil (ARICEPT) 10 MG tablet Take 1 tablet (10 mg total) by mouth at bedtime as needed. Patient taking differently: Take 10 mg by mouth at bedtime.  06/13/14  Yes Myrlene Broker, MD  finasteride (PROSCAR) 5 MG tablet Take 5 mg by mouth every evening.    Yes Historical Provider, MD  loperamide (IMODIUM) 2 MG capsule Take 2 mg by mouth 2 (two) times daily.    Yes Historical Provider, MD  metFORMIN (GLUCOPHAGE) 1000 MG tablet Take 1 tablet (1,000 mg total) by mouth 2 (two) times daily with a meal. 05/20/14 08/24/2015 Yes Myrlene Broker, MD  metoprolol tartrate (LOPRESSOR) 25 MG  tablet Take 12.5 mg by mouth 2 (two) times daily.   Yes Historical Provider, MD  ramipril (ALTACE) 5 MG capsule Take 1 capsule (5 mg total) by mouth daily. 04/04/15  Yes Pecola LawlessWilliam F Hopper, MD  simvastatin (ZOCOR) 20 MG tablet Take 20 mg by mouth every evening.   Yes Historical Provider, MD  warfarin (COUMADIN) 5 MG tablet Take as directed by anticoagulation clinic Patient taking differently: Take 2.5-5 mg by mouth daily at 6 PM.  Takes 2.5mg  on Wed and Fri  Takes 5mg  all other days 03/31/15  Yes Kathleene Hazelhristopher D McAlhany, MD  lidocaine (XYLOCAINE) 2 % solution Use as directed 20 mLs in the mouth or throat as needed for mouth pain. 04/21/15   Myrlene BrokerElizabeth A Crawford, MD    Physical Exam: Filed Vitals:   08/18/2015 2230 08/30/2015 2300 08/31/2015 2330 08/18/15 0012  BP: 127/77 142/73 147/75 133/76  Pulse: 131 104 96 83  Temp:    98.4 F (36.9 C)  TempSrc:    Oral  Resp: 20 19 21 20   Weight:    191 lb 8 oz (86.864 kg)  SpO2: 94% 93% 93% 93%     General:  Moderately built and nourished.  Eyes: Anicteric no pallor.  ENT: No discharge from the ears eyes nose and mouth.  Neck: No mass felt.  Cardiovascular: S1 and S2 heard.  Respiratory: No rhonchi or palpitations.  Abdomen: Soft nontender bowel sounds present.  Skin: No rash.  Musculoskeletal: No edema.  Psychiatric: Patient is oriented to his name. Has dementia.  Neurologic: Alert awake oriented to his name only. Moves all extremities.  Labs on Admission:  Basic Metabolic Panel:  Recent Labs Lab 08/18/2015 1847  NA 132*  K 4.5  CL 98*  CO2 22  GLUCOSE 112*  BUN 16  CREATININE 1.01  CALCIUM 9.5   Liver Function Tests:  Recent Labs Lab 09/02/2015 1847  AST 27  ALT 14*  ALKPHOS 129*  BILITOT 0.8  PROT 7.1  ALBUMIN 4.2   No results for input(s): LIPASE, AMYLASE in the last 168 hours. No results for input(s): AMMONIA in the last 168 hours. CBC:  Recent Labs Lab 08/27/2015 1847  WBC 8.9  NEUTROABS 7.1  HGB 14.5  HCT 42.6  MCV 92.4  PLT 193   Cardiac Enzymes:  Recent Labs Lab 09/12/2015 2145  TROPONINI 0.07*    BNP (last 3 results) No results for input(s): BNP in the last 8760 hours.  ProBNP (last 3 results) No results for input(s): PROBNP in the last 8760 hours.  CBG:  Recent Labs Lab 09/08/2015 2039  GLUCAP 116*    Radiological Exams on Admission: Ct Head Wo Contrast  08/29/2015  CLINICAL DATA:  Difficulty walking EXAM:  CT HEAD WITHOUT CONTRAST TECHNIQUE: Contiguous axial images were obtained from the base of the skull through the vertex without intravenous contrast. COMPARISON:  None. FINDINGS: Considerable motion artifact is noted. No gross bony abnormality is seen. Diffuse atrophic changes are seen. No findings to suggest acute hemorrhage, acute infarction or space-occupying mass lesion are noted. Prior infarct is noted in the right frontoparietal region IMPRESSION: Chronic atrophic and ischemic changes without acute abnormality. Electronically Signed   By: Alcide CleverMark  Lukens M.D.   On: 08/23/2015 20:11   Dg Chest Port 1 View  09/01/2015  CLINICAL DATA:  Weakness EXAM: PORTABLE CHEST 1 VIEW COMPARISON:  12/25/2008 FINDINGS: Cardiac shadow is within normal limits. Aortic calcifications are again seen. The lungs are clear bilaterally. No acute bony abnormality is  seen. IMPRESSION: No active disease. Electronically Signed   By: Alcide Clever M.D.   On: 08/31/2015 21:10    EKG: Independently reviewed. A. fib with rate around 105 bpm.  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 SIRS - most likely source could be respiratory tract given the patient's productive cough in the ER. Patient's caregiver also was recently diagnosed with influenza as per the family. Check influenza PCR and follow blood cultures urine cultures at this time patient has been empirically placed on ceftriaxone and Zithromax and Tamiflu. Gently hydrate. 2. Acute encephalopathy in a patient with dementia - probably secondary to fever from developing pneumonia. If confusion persists and patient has difficulty walking and may consider MRI brain tumor and history of chronic A. Fib. 3. Chronic atrial fibrillation - rate is mildly elevated. I have  ordered 1 dose of patient's home dose of metoprolol. If still persistently elevated may need Cardizem infusion. With mildly elevated probably secondary to #1. Patient is on Coumadin which will be dosed per pharmacy. Chads 2 vasc score is more than 2. 4. Elevated troponin - probably secondary to elevated heart rate. Will cycle cardiac markers at this time. Patient on Coumadin. 5. Hypertension continue ramipril. 6. Dementia on Aricept. 7. Diabetes mellitus type 2 - will hold metformin while inpatient patient will be on sliding scale coverage.   DVT Prophylaxis Coumadin.  Code Status: DO NOT RESUSCITATE as discussed with patient's daughter.  Family Communication: Patient's daughter.  Disposition Plan: Admit to inpatient.    Tache Bobst N. [facility] Pager [phone].  If 7PM-7AM, please contact night-coverage [email or web address] Password [facility] 08/18/2015, 12:25 AM

## 2015-08-19 ENCOUNTER — Other Ambulatory Visit (HOSPITAL_COMMUNITY): Payer: Medicare Other

## 2015-08-19 ENCOUNTER — Inpatient Hospital Stay (HOSPITAL_COMMUNITY): Payer: Medicare Other

## 2015-08-19 DIAGNOSIS — F0280 Dementia in other diseases classified elsewhere without behavioral disturbance: Secondary | ICD-10-CM

## 2015-08-19 DIAGNOSIS — Z7189 Other specified counseling: Secondary | ICD-10-CM | POA: Insufficient documentation

## 2015-08-19 DIAGNOSIS — J189 Pneumonia, unspecified organism: Secondary | ICD-10-CM | POA: Diagnosis present

## 2015-08-19 DIAGNOSIS — G9340 Encephalopathy, unspecified: Secondary | ICD-10-CM

## 2015-08-19 DIAGNOSIS — G309 Alzheimer's disease, unspecified: Secondary | ICD-10-CM

## 2015-08-19 DIAGNOSIS — Z7901 Long term (current) use of anticoagulants: Secondary | ICD-10-CM

## 2015-08-19 DIAGNOSIS — Z515 Encounter for palliative care: Secondary | ICD-10-CM | POA: Insufficient documentation

## 2015-08-19 DIAGNOSIS — I214 Non-ST elevation (NSTEMI) myocardial infarction: Secondary | ICD-10-CM | POA: Diagnosis present

## 2015-08-19 LAB — COMPREHENSIVE METABOLIC PANEL
ALBUMIN: 3.3 g/dL — AB (ref 3.5–5.0)
ALT: 131 U/L — AB (ref 17–63)
AST: 273 U/L — AB (ref 15–41)
Alkaline Phosphatase: 147 U/L — ABNORMAL HIGH (ref 38–126)
Anion gap: 17 — ABNORMAL HIGH (ref 5–15)
BUN: 29 mg/dL — AB (ref 6–20)
CHLORIDE: 93 mmol/L — AB (ref 101–111)
CO2: 17 mmol/L — AB (ref 22–32)
Calcium: 8.3 mg/dL — ABNORMAL LOW (ref 8.9–10.3)
Creatinine, Ser: 1.8 mg/dL — ABNORMAL HIGH (ref 0.61–1.24)
GFR calc Af Amer: 38 mL/min — ABNORMAL LOW (ref >60)
GFR, EST NON AFRICAN AMERICAN: 33 mL/min — AB (ref >60)
Glucose, Bld: 269 mg/dL — ABNORMAL HIGH (ref 65–99)
POTASSIUM: 4 mmol/L (ref 3.5–5.1)
SODIUM: 127 mmol/L — AB (ref 135–145)
TOTAL PROTEIN: 6.2 g/dL — AB (ref 6.5–8.1)
Total Bilirubin: 1 mg/dL (ref 0.3–1.2)

## 2015-08-19 LAB — CBC
HEMATOCRIT: 40.2 % (ref 39.0–52.0)
HEMATOCRIT: 41.4 % (ref 39.0–52.0)
HEMOGLOBIN: 13.2 g/dL (ref 13.0–17.0)
Hemoglobin: 13.7 g/dL (ref 13.0–17.0)
MCH: 30.6 pg (ref 26.0–34.0)
MCH: 30.8 pg (ref 26.0–34.0)
MCHC: 32.8 g/dL (ref 30.0–36.0)
MCHC: 33.1 g/dL (ref 30.0–36.0)
MCV: 93.1 fL (ref 78.0–100.0)
MCV: 93 fL (ref 78.0–100.0)
PLATELETS: 178 10*3/uL (ref 150–400)
Platelets: 172 10*3/uL (ref 150–400)
RBC: 4.32 MIL/uL (ref 4.22–5.81)
RBC: 4.45 MIL/uL (ref 4.22–5.81)
RDW: 13.5 % (ref 11.5–15.5)
RDW: 13.7 % (ref 11.5–15.5)
WBC: 12.4 10*3/uL — AB (ref 4.0–10.5)
WBC: 11.9 10*3/uL — AB (ref 4.0–10.5)

## 2015-08-19 LAB — GLUCOSE, CAPILLARY
GLUCOSE-CAPILLARY: 242 mg/dL — AB (ref 65–99)
Glucose-Capillary: 259 mg/dL — ABNORMAL HIGH (ref 65–99)

## 2015-08-19 LAB — LACTIC ACID, PLASMA
LACTIC ACID, VENOUS: 4.2 mmol/L — AB (ref 0.5–2.0)
LACTIC ACID, VENOUS: 6.8 mmol/L — AB (ref 0.5–2.0)

## 2015-08-19 MED ORDER — FUROSEMIDE 10 MG/ML IJ SOLN: 40.0000 mg | Freq: Once | INTRAMUSCULAR | Status: DC

## 2015-08-19 MED ORDER — INSULIN ASPART 100 UNIT/ML ~~LOC~~ SOLN
0.0000 [IU] | SUBCUTANEOUS | Status: DC
  Administered 2015-08-19: 5 [IU] via SUBCUTANEOUS
  Administered 2015-08-19: 3 [IU] via SUBCUTANEOUS

## 2015-08-19 MED ORDER — SODIUM CHLORIDE 0.9 % IV BOLUS (SEPSIS): 500.0000 mL | Freq: Once | INTRAVENOUS | Status: DC

## 2015-08-19 MED ORDER — SODIUM CHLORIDE 0.9 % IV BOLUS (SEPSIS)
1000.0000 mL | Freq: Once | INTRAVENOUS | Status: AC
  Administered 2015-08-19: 1000 mL via INTRAVENOUS

## 2015-08-19 MED ORDER — MORPHINE SULFATE (PF) 2 MG/ML IV SOLN: 2.0000 mg | INTRAVENOUS | Status: DC | PRN

## 2015-08-22 LAB — CULTURE, BLOOD (ROUTINE X 2)
Culture: NO GROWTH
Culture: NO GROWTH

## 2015-09-04 ENCOUNTER — Ambulatory Visit: Payer: Self-pay | Admitting: Internal Medicine

## 2015-09-15 NOTE — Progress Notes (Signed)
Page Dr. Thedore MinsSingh to clarify orders, with order to hold ns bolus for now until he sees the patient.

## 2015-09-15 NOTE — Progress Notes (Signed)
Subjective:  Awake but confused. Respiratory problems overnight.  Objective:  Vital Signs in the last 24 hours: Temp:  [97.8 F (36.6 C)-102 F (38.9 C)] 99 F (37.2 C) (04/04 0500) Pulse Rate:  [89-123] 92 (04/04 0500) Resp:  [22-29] 22 (04/04 0428) BP: (110-155)/(75-97) 125/86 mmHg (04/04 0428) SpO2:  [88 %-98 %] 93 % (04/04 0644)  Intake/Output from previous day:  Intake/Output Summary (Last 24 hours) at 2015-09-06 0952 Last data filed at 09-06-15 0700  Gross per 24 hour  Intake 2371.3 ml  Output   1200 ml  Net 1171.3 ml    Physical Exam: General appearance: alert, no distress and confused Neck: no JVD Lungs: diffuse rhonchi Heart: irregularly irregular rhythm Skin: cool and pale, dry Neurologic: confused   Rate: 90  Rhythm: atrial fibrillation and premature ventricular contractions (PVC)  Lab Results:  Recent Labs  09/06/15 0208 2015/09/06 0701  WBC 12.4* 11.9*  HGB 13.2 13.7  PLT 172 178    Recent Labs  08/18/15 0609 2015/09/06 0701  NA 132* 127*  K 4.2 4.0  CL 100* 93*  CO2 24 17*  GLUCOSE 148* 269*  BUN 17 29*  CREATININE 1.03 1.80*    Recent Labs  08/18/15 0609 08/18/15 1238  TROPONINI 5.72* 13.87*    Recent Labs  08/18/15 0609  INR 1.97*    Scheduled Meds: . azithromycin  250 mg Intravenous Q24H  . donepezil  10 mg Oral QHS  . finasteride  5 mg Oral QPM  . insulin aspart  0-9 Units Subcutaneous 6 times per day  . levalbuterol  0.63 mg Nebulization Q6H  . loperamide  2 mg Oral BID  . metoprolol tartrate  12.5 mg Oral BID  . piperacillin-tazobactam (ZOSYN)  IV  3.375 g Intravenous 3 times per day  . simvastatin  20 mg Oral QPM  . sodium chloride  500 mL Intravenous Once  . [START ON 08/20/2015] warfarin  2.5 mg Oral Once per day on Wed Sat  . warfarin  5 mg Oral Once per day on Sun Mon Tue Thu Fri  . Warfarin - Pharmacist Dosing Inpatient   Does not apply q1800   Continuous Infusions: . sodium chloride 100 mL/hr at 09/06/15  0700   PRN Meds:.acetaminophen, levalbuterol, metoprolol   Imaging: Imaging results have been reviewed  Cardiac Studies:  Assessment/Plan:  82 y.o. male with a history of non obstructive CAD on remote cardiac catheterization, chronic atrial fib- CHADs2VASc=4,  on coumadin (previosly on Xarelto), carotid artery disease s.p left CEA 2008, HTN, DM, HL and advanced dementia who brought to ED 08/30/2015 by family due to confusion. He has been at home with his family and they have help as well. It appears he has RLL CAP. His Troponin was slightly positive on admission and we were consulted. His Troponin has peaked at 13.  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 I doubt this changes are recommendation for conservative work up and treatment. We could consider an echo to help guide therapy- will review with MD. He is  on a statin, beta blocker, and Coumadin from a cardiac standpoint.  He received 40 mg IV Lasix last night x 1.   Corine ShelterLuke Kilroy PA-C 08/16/2015, 9:52 AM [phone]  I have examined the patient and reviewed assessment and plan.  Agree with above as stated.  When I went to see the patient it appeared that he had passed away peacefully.   Devanie Galanti S.

## 2015-09-15 NOTE — Progress Notes (Signed)
Patient lactic acid this am was called from lab. As critical 6.8 sounding congested again this but maintaining oxygen saturation at 4l/[place]. Leighton ParodyK. KirbyNP was notified.

## 2015-09-15 NOTE — Progress Notes (Signed)
Received patient on distress, sound very congested bilaterally coarse and with congested cough. Tachypneic, labored breathing with severe retraction afib with HR >120, pt. Restless baseline confused oreinted to self but follow commands. O2 saturation RA was in low 80's. Intermittently pulling oxygen [name] at 4liters. Juan ParodyK. KirbyNP was notified of patient VS and symptoms with orders made. Stat chest x-ray done, Lasix 40mg . Iv given and Lopressor 2.5mg . IV. Kept NPO for now, placed condom cathter. Will continue to monitor.

## 2015-09-15 NOTE — Progress Notes (Signed)
Patient sweaty and diaphoretic, HR remains 110's-120's atria firbrillation BP stable, Donnamarie PoagK. Kirby NP in and assesed patient with further orders made. Rectal was checked and it was 102. Made aware of temp. And chest x-ray result. Lactic acid was called from the lab. Was 3.2. 500cc NS bolus was given PR , will monitor vital signs.

## 2015-09-15 NOTE — Consult Note (Signed)
Consultation Note Date: August 28, 2015   Patient Name: Juan Carroll  DOB: 1928/01/29  MRN: [id number]  Age / Sex: 82 y.o., male  PCP: Juan Koch, MD Referring Physician: Thurnell Lose, MD   The patient also has a primary care physician at the [facility], Dr. Deloria Carroll His cardiologist is Dr. Angelena Carroll, [place]  Reason for Consultation: Establishing goals of care  82 year old gentleman with advanced dementia, chronic atrial fibrillation on Coumadin, and a history of carotid artery disease, was admitted on April 2 with sepsis secondary to pneumonia as well NSTEMI.  On the night of April 3, he had an aspiration event. He is now on 4 L of oxygen and his troponin is greater than 13.  Clinical Assessment/Narrative: I met with his daughter Juan Carroll, and his caretaker Juan Carroll at bedside. 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 He was living at home with his daughter, her husband, and a full-time caretaker. Prior to admission he was ambulating very well and eating very well. However, his daughter told me he is up multiple times at night coming into her bedroom and eating anything that was in the refrigerator (even cat food). He becomes anxious at times particularly when he is ready to leave a restaurant or the hospital. In general, he has been doing very well functionally until this admission. He was not on oxygen at home.  Recently the patient has developed an increased involuntary hand tremor and his gait has changed. He takes small steps. Juan Carroll describes a parkinsonian picture.  Juan Carroll, his daughter is also his durable and healthcare power of attorney. Juan Carroll also has a son. Juan Carroll is working hard to prepare herself well for what her father will need. She has very realistic expectations. We discussed the trajectory of dementia, the steep drop offs that are associated with it,  and what the end stages look like.  Juan Carroll's goal for her father is to maintain his quality of life. She wants to increase his happiness rather than increase the number of his days.  As he was able to walk and function very well prior to admission Juan Carroll is hopeful that her father will be discharged to acute rehabilitation possibly at [facility] as he has visited friends there in the past. After acute rehabilitation he may stay in a nursing facility long-term. At that time he may be hospice eligible.  With regards to aspiration, the family requests no PEG tube. They want him to be fed in the safest way possible. With regards to anticoagulation, when risks of bleeding outweigh the benefits of stroke prevention, the family expects Coumadin to be discontinued.     When he comes closer to end-of-life I anticipate the family will readily agree with comfort care.  Contacts/Participants in Discussion:  Patient, Juan Carroll (daughter), [facility] (caretaker) Primary Decision Maker: Juan Carroll Relationship to Patient daughter HCPOA: Yes   SUMMARY OF RECOMMENDATIONS DNR/DNI No PEG tube, feed in the safest way possible regardless of the risk of aspiration Emphasize quality of life over quantity of life Likely discharged to skilled nursing facility-[facility] if possible as the patient is familiar with this facility.  Code Status/Advance Care Planning: DNR    Other Directives:Advanced Directive  Symptom Management:   Will consider possible therapies for anxiety and insomnia.  Palliative Prophylaxis:   Aspiration and Delirium Protocol    Psycho-social/Spiritual:  Support System: Adequate Additional Recommendations: Caregiving  Support/Resources  Prognosis: < 12 months  Discharge Planning: Skilled Nursing  Facility for rehab with Palliative care service follow-up   Chief Complaint/ Primary Diagnoses: Present on Admission:  . A-fib ([facility]) . SIRS (systemic inflammatory response syndrome) (HCC) .  Acute encephalopathy . Essential hypertension . Diabetes mellitus with microalbuminuric diabetic nephropathy ([facility]) . Pressure ulcer . Alzheimer's dementia . PERIPHERAL VASCULAR DISEASE . CAD - nonobstructive by cath in past . NSTEMI - Troponin 13 . CAP (community acquired pneumonia)  I have reviewed the medical record, interviewed the patient and family, and examined the patient. The following aspects are pertinent.  Past Medical History  Diagnosis Date  . Degeneration of intervertebral disc, site unspecified   . Occlusion and stenosis of carotid artery without mention of cerebral infarction   . Unspecified essential hypertension   . Inguinal hernia without mention of obstruction or gangrene, unilateral or unspecified, (not specified as recurrent)   . Memory loss   . Peripheral vascular disease, unspecified ([facility])   . Type II or unspecified type diabetes mellitus without mention of complication, not stated as uncontrolled   . Coronary atherosclerosis of unspecified type of vessel, native or graft   . Elevated prostate specific antigen (PSA)   . Atrial fibrillation ([facility])   . Other and unspecified hyperlipidemia   . Chronic atrial fibrillation (HCC)     on rate control, medicines and coumadin  . Hypertension   . Hyperlipemia   . Carotid stenosis     status post left carotid endarterectomy in 2008   Social History   Social History  . Marital Status: Widowed    Spouse Name: N/A  . Number of Children: 2  . Years of Education: N/A   Occupational History  . grounds Neurosurgeon course   Social History Main Topics  . Smoking status: Former Research scientist (life sciences)  . Smokeless tobacco: Never Used  . Alcohol Use: No  . Drug Use: No  . Sexual Activity:    Partners: Female   Other Topics Concern  . None   Social History Narrative   Widowed; married in 1960. Enjoys playing lots of golf. Has a grown daughter (1962) who is his primary care-taker and son  (1966), several grandchildren. 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 He still plays a lot of golf in his retirement. Discussed end of life care: he does not favor CPR or mechanical ventilator support.   Family History  Problem Relation Age of Onset  . Stroke Mother   . Parkinsonism Brother    Scheduled Meds: . azithromycin  250 mg Intravenous Q24H  . donepezil  10 mg Oral QHS  . finasteride  5 mg Oral QPM  . insulin aspart  0-9 Units Subcutaneous 6 times per day  . levalbuterol  0.63 mg Nebulization Q6H  . loperamide  2 mg Oral BID  . metoprolol tartrate  12.5 mg Oral BID  . piperacillin-tazobactam (ZOSYN)  IV  3.375 g Intravenous 3 times per day  . simvastatin  20 mg Oral QPM  . sodium chloride  500 mL Intravenous Once  . [START ON 08/20/2015] warfarin  2.5 mg Oral Once per day on Wed Sat  . warfarin  5 mg Oral Once per day on Sun Mon Tue Thu Fri  . Warfarin - Pharmacist Dosing Inpatient   Does not apply q1800   Continuous Infusions: . sodium chloride 100 mL/hr at 17-Sep-2015 0700   PRN Meds:.acetaminophen, levalbuterol, metoprolol Medications Prior to Admission:  Prior to Admission medications  Medication Sig Start Date End Date Taking? Authorizing Provider  donepezil (ARICEPT) 10 MG tablet Take 1 tablet (10 mg total) by mouth at bedtime as needed. Patient taking differently: Take 10 mg by mouth at bedtime.  06/13/14  Yes Juan Koch, MD  finasteride (PROSCAR) 5 MG tablet Take 5 mg by mouth every evening.    Yes Historical Provider, MD  loperamide (IMODIUM) 2 MG capsule Take 2 mg by mouth 2 (two) times daily.    Yes Historical Provider, MD  metFORMIN (GLUCOPHAGE) 1000 MG tablet Take 1 tablet (1,000 mg total) by mouth 2 (two) times daily with a meal. 05/20/14 09/11/2015 Yes Juan Koch, MD  metoprolol tartrate (LOPRESSOR) 25 MG tablet Take 12.5 mg by mouth 2 (two) times daily.   Yes Historical Provider, MD  ramipril (ALTACE) 5 MG capsule  Take 1 capsule (5 mg total) by mouth daily. 04/04/15  Yes Hendricks Limes, MD  simvastatin (ZOCOR) 20 MG tablet Take 20 mg by mouth every evening.   Yes Historical Provider, MD  warfarin (COUMADIN) 5 MG tablet Take as directed by anticoagulation clinic Patient taking differently: Take 2.5-5 mg by mouth daily at 6 PM. Takes 2.75m on Wed and Fri  Takes 541mall other days 03/31/15  Yes ChBurnell BlanksMD  lidocaine (XYLOCAINE) 2 % solution Use as directed 20 mLs in the mouth or throat as needed for mouth pain. 04/21/15   ElHoyt KochMD   Allergies  Allergen Reactions  . Sulfa Antibiotics Hives    Review of Systems  Eyes: Positive for pain.  : Patient unable to provide due to advanced dementia  Physical Exam  Well-developed elderly male, pleasantly confused, gives incomplete and inaccurate answers to questions Head is atraumatic normocephalic, he has moist membranes Respiratory: Wet cough, expiratory crackles Cardiac: Irregularly irregular Abdomen: Soft, positive bowel sounds, nontender Extremities: No significant edema, able to move all 4  Vital Signs: BP 125/86 mmHg  Carroll 92  Temp(Src) 99 F (37.2 C) (Rectal)  Resp 22  Wt 86.864 kg (191 lb 8 oz)  SpO2 93%  SpO2: SpO2: 93 % O2 Device:SpO2: 93 % O2 Flow Rate: .O2 Flow Rate (L/min): 4 L/min  IO: Intake/output summary:  Intake/Output Summary (Last 24 hours) at [id number] Last data filed at [id number]  Gross per 24 hour  Intake 2371.3 ml  Output   1200 ml  Net 1171.3 ml    LBM:   Baseline Weight: Weight: 86.864 kg (191 lb 8 oz) Most recent weight: Weight: 86.864 kg (191 lb 8 oz)      Palliative Assessment/Data:  Flowsheet Rows        Most Recent Value   Intake Tab    Referral Department  Hospitalist   Unit at Time of Referral  Cardiac/Telemetry Unit   Palliative Care Primary Diagnosis  Neurology   Date Notified  0409-Apr-2017 Palliative Care Type  New Palliative care   Date of Admission   09/05/2015   Date first seen by Palliative Care  0409-Apr-2017 # of days Palliative referral response time  0 Day(s)   # of days IP prior to Palliative referral  2   Clinical Assessment    Palliative Performance Scale Score  30%   Dyspnea Max Last 24 Hours  8   Dyspnea Min Last 24 hours  2   Anxiety Max Last 24 Hours  4   Anxiety Min Last 24 Hours  2   Psychosocial & Spiritual  Assessment    Palliative Care Outcomes    Patient/Family meeting held?  Yes   Who was at the meeting?  patient, care giver [facility]) and daughter, [facility]   Palliative Care Outcomes  Clarified goals of care   Patient/Family wishes: Interventions discontinued/not started   PEG      Additional Data Reviewed:  CBC:    Component Value Date/Time   WBC 11.9* 2015-08-25 0701   HGB 13.7 Aug 25, 2015 0701   HCT 41.4 August 25, 2015 0701   PLT 178 August 25, 2015 0701   MCV 93.0 08-25-15 0701   NEUTROABS 13.2* 08/18/2015 2140   LYMPHSABS 0.9 08/18/2015 2140   MONOABS 1.3* 08/18/2015 2140   EOSABS 0.0 08/18/2015 2140   BASOSABS 0.0 08/18/2015 2140   Comprehensive Metabolic Panel:    Component Value Date/Time   NA 127* 25-Aug-2015 0701   K 4.0 25-Aug-2015 0701   CL 93* 08-25-15 0701   CO2 17* 08-25-15 0701   BUN 29* 2015-08-25 0701   CREATININE 1.80* 2015-08-25 0701   GLUCOSE 269* 08/25/15 0701   CALCIUM 8.3* 08/25/2015 0701   AST 273* 2015-08-25 0701   ALT 131* 08/25/15 0701   ALKPHOS 147* 2015/08/25 0701   BILITOT 1.0 08/25/15 0701   PROT 6.2* 08-25-15 0701   ALBUMIN 3.3* 08/25/15 0701    Time In: 9:00 Time Out: 10:10 Time Total: 70 min. Greater than 50%  of this time was spent counseling and coordinating care related to the above assessment and plan.  Signed by:  Imogene Burn, PA-C Palliative Medicine Pager: [phone]   Aug 25, 2015, 10:18 AM  Please contact Palliative Medicine Team phone at [phone] for questions and concerns.

## 2015-09-15 NOTE — Progress Notes (Signed)
Patients lactic acid trending up 4.2 K. KirbyNp was notified with orders made NS bolus on going.

## 2015-09-15 NOTE — Progress Notes (Signed)
Notified of Vtach. Patient resting in bed no distress. Will continue to monitor. Patient is a DNR.   Megham Dwyer, Charlaine DaltonAnn Brooke RN

## 2015-09-15 NOTE — Progress Notes (Signed)
SLP  Note  Patient Details Name: Barrie DunkerHomer D Kohlmeyer MRN: [id number] DOB: 10/25/1927  Noted events over night with changes in chest x-ray results from last night and this am as well as orders for re-evaluation of swallow function.  Given that there were no overt s/s of aspiration at bedside yesterday an objective assessment, MBS is warranted today to assess for silent aspiration.  SLP has called RN, Nehemiah SettleBrooke to confirm that patient is medically stable to transport to radiology and able to participate in the assessment.  Fae PippinMelissa Keisuke Hollabaugh, M.A., CCC-SLP [phone]  Laporche Martelle 09/14/2015, 8:47 AM

## 2015-09-15 NOTE — Progress Notes (Signed)
ANTICOAGULATION CONSULT NOTE  Pharmacy Consult for Warfarin  Indication: atrial fibrillation  Allergies  Allergen Reactions  . Sulfa Antibiotics Hives    Patient Measurements: Weight: 191 lb 8 oz (86.864 kg)  Vital Signs: Temp: 99 F (37.2 C) (04/04 0500) Temp Source: Rectal (04/04 0500) BP: 125/86 mmHg (04/04 0428) Pulse Rate: 92 (04/04 0500)  Labs:  Recent Labs  Jan 14, 2016 1847  08/18/15 0035 08/18/15 0609 08/18/15 1238 08/18/15 2140 08/25/2015 0208 08/22/2015 0701  HGB 14.5  --   --  11.8*  --  13.3 13.2 13.7  HCT 42.6  --   --  34.2*  --  40.1 40.2 41.4  PLT 193  --   --  153  --  191 172 178  APTT 34  --   --   --   --   --   --   --   LABPROT 22.3*  --   --  22.3*  --   --   --   --   INR 1.97*  --   --  1.97*  --   --   --   --   CREATININE 1.01  --   --  1.03  --   --   --  1.80*  TROPONINI  --   < > 0.92* 5.72* 13.87*  --   --   --   < > = values in this interval not displayed.  CrCl cannot be calculated (Unknown ideal weight.).   Assessment: 82 y/o M with confusion/weakness and here with NSTEMI and PNA.  Continuing chronic warfarin therapy for afib, INR 1.97 on 4/3, potential for DDI with antibiotics. Hg stable  Outpatient warfarin dosing regimen is 2.5 mg on Wed/Sat and 5 mg all other days   Goal of Therapy:  INR 2-3 Monitor platelets by anticoagulation protocol: Yes   Plan:  -Warfarin per outpatient regimen -Daily PT/INR, adjust dose as needed  Harland Germanndrew Amedio Bowlby, Pharm D 08/31/2015 8:37 AM

## 2015-09-15 NOTE — Progress Notes (Signed)
Called to the patients room patient gasping for breath and blue. Heart rate was 30-40s.  Patient is a DNR and MD notified. Family notified and they confirmed that they don't want anything done. Listened at 1102 by two nurse and there was no heart beat. Pt time of death is 1102.   Keyly Baldonado, Charlaine DaltonAnn Brooke RN

## 2015-09-15 NOTE — Discharge Summary (Signed)
[facility] Death Note                                                                                                                                                                                                Death Note please see Last Note for all details.   In breif -    Juan Carroll [id number] is a 82 y.o. male, Outpatient Primary MD for the patient is Myrlene Broker, MD  Pronounced dead by RN on 09-17-15   @  Oct 24, 2022 am                 Cause of death cardiac arrest due to underlying aspiration pneumonia, patient was admitted for recurrent aspiration pneumonia, he showed initial improvement however since late evening yesterday he showed progressive decline and evidence of recurrent aspiration despite antibiotic and speech therapy assistance, I had detailed discussions with family this morning that his prognosis appeared extremely poor, palliative care was consulted, goal of care was gentle medical treatment with full comfort care if he declined further. However soon after we had this discussion and was seen by palliative care patient passed away in his bed.   Signature  Leroy Sea M.D on Sep 17, 2015 at 11:27 AM  [facility]  Office Phone [phone]  Total clinical and documentation time for today Under 30 minutes   Last Note                                                                                                                        [facility] Progress Note  Patient Demographics:    Juan Carroll, is a 82 y.o. male, DOB - 1928-04-30, [id number]  Admit date - 09/12/2015   Admitting Physician Eduard Clos, MD  Outpatient Primary MD  for the patient is Myrlene Broker, MD  LOS - 1  Outpatient Specialists:   Chief Complaint  Patient presents with  . Gait Problem        Subjective:    Juan Carroll today has, No headache, No chest pain, No abdominal pain - No Nausea, No new weakness tingling or numbness, No Cough - SOB. Unreliable historian   Assessment  & Plan :     1.Sepsis due to community-acquired pneumonia. Much improved on IV empiric antibiotics which will be continued, continue supportive care with oxygen and nebulizer treatments, follow cultures. Speech has cleared the patient from aspiration standpoint.  2. NSTEMI. Currently on combination of Coumadin, beta blocker and statin for secondary prevention. Cardiology on board. Due to combination of pneumonia, advanced dementia patient is not a candidate for invasive procedures or intervention. Continue medical treatment. He is chest pain-free.  3. Advanced dementia. At risk for delirium. Minimize narcotics and benzodiazepines, continue home medications which include Aricept.  4. Essential hypertension. Continue combination of beta blocker and ACE inhibitor.  5. Dyslipidemia. Home dose statin continued.  6. Chronic atrial fibrillation. [place] vasc 2 score of greater than 4. On beta blocker and Coumadin, pharmacy monitoring INR. Goal is rate control.  7. DM type II. Glucophage held, on sliding scale.  Lab Results  Component Value Date   HGBA1C 7.3* 01/09/2015   CBG (last 3)   Recent Labs  08/18/15 1607 08/18/15 2238 25-Aug-2015 0543  GLUCAP 133* 259* 242*      Code Status : DNR  Family Communication  : None  Disposition Plan  : TBD  Barriers For Discharge : CAP  Consults  :  CARDS  Procedures  :    DVT Prophylaxis  :  Coumadin  Lab Results  Component Value Date   INR 1.97* 08/18/2015   INR 1.97* 08/18/2015   INR 2.7 08/08/2015   PROTIME 21.2 11/01/2008     Lab Results  Component Value Date   PLT 178 08/25/15     Antibiotics  :    Anti-infectives    Start     Dose/Rate Route Frequency Ordered Stop   08/18/15 2315  piperacillin-tazobactam (ZOSYN) IVPB 3.375 g     3.375 g 12.5 mL/hr over 240 Minutes Intravenous 3 times per day 08/18/15 2253     08/18/15 2300  azithromycin (ZITHROMAX) 250 mg in dextrose 5 % 125 mL IVPB     250 mg 125 mL/hr over 60 Minutes Intravenous Every 24 hours 08/18/15 2251     08/18/15 2200  azithromycin (ZITHROMAX) 500 mg in dextrose 5 % 250 mL IVPB  Status:  Discontinued     500 mg 250 mL/hr over 60 Minutes Intravenous Every 24 hours 08/18/15 0024 08/18/15 2249   08/18/15 2200  cefTRIAXone (ROCEPHIN) 1 g in dextrose 5 % 50 mL IVPB  Status:  Discontinued     1 g 100 mL/hr over 30 Minutes Intravenous Every 24 hours 08/18/15 0033 08/18/15 2251   08/18/15 0030  oseltamivir (TAMIFLU) capsule 30 mg  Status:  Discontinued     30 mg Oral 2 times daily 08/18/15 0024 08/18/15 1129   09/13/2015 2300  cefTRIAXone (ROCEPHIN) 1 g in dextrose 5 % 50 mL IVPB     1 g 100 mL/hr over 30 Minutes  Intravenous  Once 12/28/2015 2254 12/28/2015 2356   12/28/2015 2300  azithromycin (ZITHROMAX) 500 mg in dextrose 5 % 250 mL IVPB     500 mg 250 mL/hr over 60 Minutes Intravenous  Once 12/28/2015 2254 08/18/15 0033        Objective:   Filed Vitals:   09/04/2015 0259 08/24/2015 0428 08/29/2015 0500 08/27/2015 0644  BP: 136/97 125/86    Pulse: 110 95 92   Temp:   99 F (37.2 C)   TempSrc:   Rectal   Resp:  22    Weight:      SpO2:  95%  93%    Wt Readings from Last 3 Encounters:  08/18/15 86.864 kg (191 lb 8 oz)  03/27/15 91.173 kg (201 lb)  01/09/15 93.895 kg (207 lb)     Intake/Output Summary (Last 24 hours) at 08/27/2015 1127 Last data filed at 09/11/2015 0700  Gross per 24 hour  Intake 2371.3 ml  Output   1200 ml  Net 1171.3 ml     Physical Exam  Awake, pleasantly confused, No new F.N deficits, Normal affect [place].AT,PERRAL Supple Neck,No JVD, No cervical lymphadenopathy appriciated.   Symmetrical Chest wall movement, Good air movement bilaterally, CTAB RRR,No Gallops,Rubs or new Murmurs, No Parasternal Heave +ve B.Sounds, Abd Soft, No tenderness, No organomegaly appriciated, No rebound - guarding or rigidity. No Cyanosis, Clubbing or edema, No new Rash or bruise       Data Review:    CBC  Recent Labs Lab 12/28/2015 1847 08/18/15 0609 08/18/15 2140 08/31/2015 0208 08/24/2015 0701  WBC 8.9 6.2 15.4* 12.4* 11.9*  HGB 14.5 11.8* 13.3 13.2 13.7  HCT 42.6 34.2* 40.1 40.2 41.4  PLT 193 153 191 172 178  MCV 92.4 92.2 92.4 93.1 93.0  MCH 31.5 31.8 30.6 30.6 30.8  MCHC 34.0 34.5 33.2 32.8 33.1  RDW 13.4 13.5 13.5 13.5 13.7  LYMPHSABS 0.8  --  0.9  --   --   MONOABS 0.9  --  1.3*  --   --   EOSABS 0.1  --  0.0  --   --   BASOSABS 0.0  --  0.0  --   --     Chemistries   Recent Labs Lab 12/28/2015 1847 08/18/15 0609 09/06/2015 0701  NA 132* 132* 127*  K 4.5 4.2 4.0  CL 98* 100* 93*  CO2 22 24 17*  GLUCOSE 112* 148* 269*  BUN 16 17 29*  CREATININE 1.01 1.03 1.80*  CALCIUM 9.5 8.4* 8.3*  AST 27 64* 273*  ALT 14* 24 131*  ALKPHOS 129* 101 147*  BILITOT 0.8 1.0 1.0   ------------------------------------------------------------------------------------------------------------------ No results for input(s): CHOL, HDL, LDLCALC, TRIG, CHOLHDL, LDLDIRECT in the last 72 hours.  Lab Results  Component Value Date   HGBA1C 7.3* 01/09/2015   ------------------------------------------------------------------------------------------------------------------ No results for input(s): TSH, T4TOTAL, T3FREE, THYROIDAB in the last 72 hours.  Invalid input(s): FREET3 ------------------------------------------------------------------------------------------------------------------ No results for input(s): VITAMINB12, FOLATE, FERRITIN, TIBC, IRON, RETICCTPCT in the last 72 hours.  Coagulation profile  Recent Labs Lab 12/28/2015 1847 08/18/15 0609  INR 1.97* 1.97*    No  results for input(s): DDIMER in the last 72 hours.  Cardiac Enzymes  Recent Labs Lab 08/18/15 0035 08/18/15 0609 08/18/15 1238  TROPONINI 0.92* 5.72* 13.87*   ------------------------------------------------------------------------------------------------------------------ No results found for: BNP  Inpatient Medications  Scheduled Meds: . azithromycin  250 mg Intravenous Q24H  . donepezil  10 mg Oral QHS  . finasteride  5 mg Oral  QPM  . furosemide  40 mg Intravenous Once  . insulin aspart  0-9 Units Subcutaneous 6 times per day  . levalbuterol  0.63 mg Nebulization Q6H  . loperamide  2 mg Oral BID  . metoprolol tartrate  12.5 mg Oral BID  . piperacillin-tazobactam (ZOSYN)  IV  3.375 g Intravenous 3 times per day  . simvastatin  20 mg Oral QPM  . sodium chloride  500 mL Intravenous Once  . [START ON 08/20/2015] warfarin  2.5 mg Oral Once per day on Wed Sat  . warfarin  5 mg Oral Once per day on Sun Mon Tue Thu Fri  . Warfarin - Pharmacist Dosing Inpatient   Does not apply q1800   Continuous Infusions:   PRN Meds:.  Micro Results Recent Results (from the past 240 hour(s))  Blood culture (routine x 2)     Status: None (Preliminary result)   Collection Time: 10-Sep-2015 10:24 PM  Result Value Ref Range Status   Specimen Description BLOOD RIGHT ANTECUBITAL  Final   Special Requests BOTTLES DRAWN AEROBIC ONLY 10CC  Final   Culture NO GROWTH < 12 HOURS  Final   Report Status PENDING  Incomplete  Blood culture (routine x 2)     Status: None (Preliminary result)   Collection Time: September 10, 2015 10:29 PM  Result Value Ref Range Status   Specimen Description BLOOD LEFT HAND  Final   Special Requests BOTTLES DRAWN AEROBIC AND ANAEROBIC 5CC  Final   Culture NO GROWTH < 12 HOURS  Final   Report Status PENDING  Incomplete    Radiology Reports Ct Head Wo Contrast  09/10/2015  CLINICAL DATA:  Difficulty walking EXAM: CT HEAD WITHOUT CONTRAST TECHNIQUE: Contiguous axial images were  obtained from the base of the skull through the vertex without intravenous contrast. COMPARISON:  None. FINDINGS: Considerable motion artifact is noted. No gross bony abnormality is seen. Diffuse atrophic changes are seen. No findings to suggest acute hemorrhage, acute infarction or space-occupying mass lesion are noted. Prior infarct is noted in the right frontoparietal region IMPRESSION: Chronic atrophic and ischemic changes without acute abnormality. Electronically Signed   By: Alcide Clever M.D.   On: 09-10-15 20:11   Dg Chest Port 1 View  09/11/2015  CLINICAL DATA:  Shortness of Breath EXAM: PORTABLE CHEST 1 VIEW COMPARISON:  August 18, 2015 FINDINGS: Airspace opacity in the right lower lobe and to a lesser extent right upper lobe remains with slight partial clearing on the right compared to 1 day prior. There is subtle left perihilar airspace opacity, new from 1 day prior. Lungs elsewhere clear. Heart is upper normal in size with pulmonary vascularity within normal limits. No adenopathy. There is atherosclerotic calcification in the aorta and carotid arteries. IMPRESSION: Extensive airspace opacity on the right, slightly less than on 1 day prior. New left perihilar airspace opacity. Suspect multifocal pneumonia, although pulmonary edema is a differential consideration. Heart upper normal in size, stable. Areas of arterial vascular calcification. Note calcification in the carotid arteries. Electronically Signed   By: Bretta Bang III M.D.   On: 09/05/2015 07:58   Dg Chest Port 1 View  08/18/2015  CLINICAL DATA:  Hypoxia EXAM: PORTABLE CHEST 1 VIEW COMPARISON:  2015-09-10 FINDINGS: There is right lower lobe airspace disease and to lesser degree right upper lobe airspace disease most concerning for pneumonia. There is no pleural effusion or pneumothorax. The heart and mediastinal contours are unremarkable. The osseous structures are unremarkable. IMPRESSION: Right lower lobe airspace disease and  to lesser  degree right upper lobe airspace disease most concerning for pneumonia. Electronically Signed   By: Elige Ko   On: 08/18/2015 20:35   Dg Chest Port 1 View  2015-09-08  CLINICAL DATA:  Weakness EXAM: PORTABLE CHEST 1 VIEW COMPARISON:  12/25/2008 FINDINGS: Cardiac shadow is within normal limits. Aortic calcifications are again seen. The lungs are clear bilaterally. No acute bony abnormality is seen. IMPRESSION: No active disease. Electronically Signed   By: Alcide Clever M.D.   On: Sep 08, 2015 21:10    Time Spent in minutes  35   Eshaal Duby K M.D on 08/30/2015 at 11:27 AM  Between 7am to 7pm - Pager - [phone]  After 7pm go to [email or web address] - password [facility]  [facility] -  Office  [phone]

## 2015-09-15 DEATH — deceased

## 2015-10-03 ENCOUNTER — Ambulatory Visit: Payer: Medicare Other | Admitting: Podiatry
